# Patient Record
Sex: Male | Born: 1950
Health system: Southern US, Community
[De-identification: ages and names within clinical notes are randomized; demographics above are authoritative.]

## PROBLEM LIST (undated history)

## (undated) DIAGNOSIS — M5030 Other cervical disc degeneration, unspecified cervical region: Secondary | ICD-10-CM

## (undated) DIAGNOSIS — K222 Esophageal obstruction: Secondary | ICD-10-CM

## (undated) DIAGNOSIS — E119 Type 2 diabetes mellitus without complications: Secondary | ICD-10-CM

## (undated) DIAGNOSIS — I471 Supraventricular tachycardia, unspecified: Secondary | ICD-10-CM

## (undated) DIAGNOSIS — Z973 Presence of spectacles and contact lenses: Secondary | ICD-10-CM

## (undated) DIAGNOSIS — H9319 Tinnitus, unspecified ear: Secondary | ICD-10-CM

## (undated) DIAGNOSIS — I499 Cardiac arrhythmia, unspecified: Secondary | ICD-10-CM

## (undated) DIAGNOSIS — J309 Allergic rhinitis, unspecified: Secondary | ICD-10-CM

## (undated) DIAGNOSIS — K219 Gastro-esophageal reflux disease without esophagitis: Secondary | ICD-10-CM

## (undated) DIAGNOSIS — M1990 Unspecified osteoarthritis, unspecified site: Secondary | ICD-10-CM

## (undated) HISTORY — PX: TONSILLECTOMY: SUR1361

## (undated) HISTORY — DX: Allergic rhinitis, unspecified: J30.9

## (undated) HISTORY — DX: Supraventricular tachycardia, unspecified: I47.10

## (undated) HISTORY — DX: Type 2 diabetes mellitus without complications: E11.9

## (undated) HISTORY — DX: Esophageal obstruction: K22.2

## (undated) HISTORY — DX: Other cervical disc degeneration, unspecified cervical region: M50.30

## (undated) HISTORY — DX: Supraventricular tachycardia: I47.1

## (undated) HISTORY — PX: APPENDECTOMY: SHX54

## (undated) HISTORY — PX: INGUINAL HERNIA REPAIR: SUR1180

---
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
## 1973-10-30 HISTORY — PX: ORIF WRIST FRACTURE: SHX2133

## 2004-12-27 ENCOUNTER — Ambulatory Visit: Payer: Self-pay

## 2004-12-30 ENCOUNTER — Ambulatory Visit: Payer: Self-pay | Admitting: Internal Medicine

## 2005-01-06 ENCOUNTER — Ambulatory Visit: Payer: Self-pay

## 2005-01-25 ENCOUNTER — Ambulatory Visit: Payer: Self-pay | Admitting: Internal Medicine

## 2005-03-02 ENCOUNTER — Ambulatory Visit: Payer: Self-pay | Admitting: Internal Medicine

## 2006-03-06 ENCOUNTER — Emergency Department (HOSPITAL_COMMUNITY): Admission: EM | Admit: 2006-03-06 | Discharge: 2006-03-06 | Payer: Self-pay | Admitting: Emergency Medicine

## 2008-10-19 ENCOUNTER — Ambulatory Visit: Payer: Self-pay | Admitting: Internal Medicine

## 2008-10-30 HISTORY — PX: CARDIAC CATHETERIZATION: SHX172

## 2008-11-10 ENCOUNTER — Ambulatory Visit: Payer: Self-pay

## 2008-12-09 ENCOUNTER — Ambulatory Visit: Payer: Self-pay | Admitting: Internal Medicine

## 2008-12-09 ENCOUNTER — Ambulatory Visit (HOSPITAL_COMMUNITY): Admission: RE | Admit: 2008-12-09 | Discharge: 2008-12-09 | Payer: Self-pay | Admitting: Internal Medicine

## 2008-12-16 ENCOUNTER — Ambulatory Visit: Payer: Self-pay | Admitting: Internal Medicine

## 2009-01-15 ENCOUNTER — Ambulatory Visit: Payer: Self-pay | Admitting: Internal Medicine

## 2011-02-14 LAB — CBC
MCHC: 35 g/dL (ref 30.0–36.0)
MCV: 89.5 fL (ref 78.0–100.0)
Platelets: 162 10*3/uL (ref 150–400)
WBC: 6.2 10*3/uL (ref 4.0–10.5)

## 2011-02-14 LAB — APTT: aPTT: 26 seconds (ref 24–37)

## 2011-02-14 LAB — BASIC METABOLIC PANEL
BUN: 15 mg/dL (ref 6–23)
Calcium: 9.2 mg/dL (ref 8.4–10.5)
Chloride: 107 mEq/L (ref 96–112)
Creatinine, Ser: 0.72 mg/dL (ref 0.4–1.5)

## 2011-03-14 NOTE — Assessment & Plan Note (Signed)
[facility]                         ELECTROPHYSIOLOGY OFFICE NOTE   CLEMENCE, VERMEER                 MRN:          [id number]  DATE:12/16/2008                            DOB:          [birth year]    INTRODUCTION:  Mr. 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  Graciela Husbands on December 09, 2008, who presents today for further evaluation of  right groin pain.   PROBLEM LIST:  1. 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  2. Gastroesophageal reflux disease.   CURRENT MEDICATIONS:  None.   INTERVAL HISTORY:  Mr. Dilorenzo presents today for followup after his  recent EP study and radiofrequency ablation on December 09, 2008.  He  notes doing well following his procedure.  Over the past 3 days, he has  noted a fullness within his right groin as well as mild pain with  ambulation.  He has noticed minimal bruising over the site and presents  today for further evaluation.  He denies moderate or severe pain and  continues to ambulate without difficulty.  He is unaware of any  strenuous activities or precipitants for his worsening discomfort.  He  denies lower extremity edema.  He also denies chest pain, shortness of  breath, palpitations, presyncope, syncope, or other concerns today.   PHYSICAL EXAMINATION:  VITAL SIGNS:  Blood pressure 123/89, heart rate  52, respirations 18, weight not checked today.  GENERAL:  Mr. Reichardt is alert and oriented x3.  HEENT:  Normocephalic, atraumatic.  Sclerae clear.  Conjunctivae pink.  Oropharynx clear.  NECK:  Supple.  No thyromegaly, lymphadenopathy, or bruits.  LUNGS:  Clear to auscultation bilaterally.  HEART:  Regular rate and rhythm.  No murmurs, rubs, or gallops.  GI:  Soft, nontender, nondistended.  Positive  bowel sounds.  EXTREMITIES:  No clubbing, cyanosis, or edema.  PELVIC:  The patient's right and left groins are without significant  tenderness to palpation, hematoma, or bruits.  He has minimal ecchymoses  over the puncture site from his recent EP procedure on the right side.  I think that his right groin is otherwise completely benign.  NEUROLOGIC:  Cranial nerves II through XII are intact.  Strength and  sensation are intact.   EKG, sinus bradycardia of 52 beats per minute, otherwise unremarkable.   IMPRESSION:  Mr. Bartusek is a pleasant 60 year old gentleman who  presents today for a groin check after his recent ablation for  supraventricular tachycardia.  He has had no further arrhythmias.  His  exam is completely benign today.  I did not hear bruit or see hematoma.  I think that a pseudoaneurysm, aneurysm, or atrioventricular fistula  would be unlikely.   PLAN:  Mr. Perrilloux will continue to avoid strenuous activities for the  next week.  I have instructed him to place warm compresses as needed to  the right groin.  He is aware to contact our office should he develop  any significant swelling, bleeding, severe  pain, further ecchymoses, or  other concerns from the right groin.  I have discussed this plan with  Dr. Graciela Husbands.     Hillis Range, MD  Electronically Signed    JA/MedQ  DD: 12/16/2008  DT: 12/17/2008  Job #: [id number]   cc:   Duke Salvia, MD, [facility]

## 2011-03-14 NOTE — Discharge Summary (Signed)
NAME:  IZAK, KIZEWSKI        ACCOUNT NO.:  [id number]   MEDICAL RECORD NO.:  [id number]          PATIENT TYPE:  OIB   LOCATION:  4715                         FACILITY:  [facility]   PHYSICIAN:  Duke Salvia, MD, FACCDATE OF BIRTH:  01-19-1951   DATE OF ADMISSION:  12/09/2008  DATE OF DISCHARGE:  12/09/2008                               DISCHARGE SUMMARY   FINAL DIAGNOSES:  1. Tachy palpitations with abrupt onset 50-year history of this.  2. Status post electrophysiology study with radiofrequency catheter      ablation of atrioventricular nodal reentry tachycardia and slow      pathway modification by Dr. Graciela Husbands.   SECONDARY DIAGNOSES:  1. Gastroesophageal reflux disease.  2. Status post herniorrhaphy x2.  3. Thyroglossal duct surgery.  4. Navicular surgery.  5. Status post appendectomy.  6. Myoview study, November 10, 2008.  This shows ejection fraction 48%.      The patient did have good exercise capacity.  The overall left      ventricular function is normal.  The patient did not have chest      pain or dyspnea during this normal stress nuclear study.   PROCEDURE:  December 09, 2008 electrophysiology study radiofrequency  catheter ablation of atrioventricular node reentry tachycardia with slow  pathway modification.   BRIEF HISTORY:  Mr. Westly is a 59 year old man.  He has recurrent  abrupt tachy palpitations.  They are abrupt in onset and offset.  They  have been going on for 50 years.  They are diuretic positive, they FROG  positive.   Symptoms are increasing shortness of breath, presyncope, chest burning  and they have started to occur more frequently with an on and off  pattern that lasts for a couple of minutes then stops, then starts again  and goes on and off from one half hour to one hour.  He has eliminated  all inciting trigger agents.  He still uses some caffeine, however.  Initiation in the past has been associated with the PVC.  The patient  will have a  Myoview study prior to undergoing catheter ablation.  These  tachyarrhythmias are supraventricular and likely represent AV reentry.  Catheter ablation has been discussed again.  The patient wishes to go  ahead with this procedure.  This will be done on an elective basis.   HOSPITAL COURSE:  The patient presents electively on December 09, 2008.  He underwent electrophysiology study with successful slow P-wave  modification with elimination of a substrate by Dr. Sherryl Manges.  The  patient in sinus rhythm at the time of discharge after the procedure on  December 09, 2008.  He goes home on his medication  which is ranitidine 300 mg twice daily.  His new medication now enteric-  coated aspirin 81 mg daily for the next 6 weeks.  He follows up with  [facility], Dr. Thomasena Edis office will call with  that appointment.   TIME FOR THIS DICTATION AND EXAM:  30 minutes.      Maple Mirza, [place]      Duke Salvia, MD, [facility]  Electronically Signed  GM/MEDQ  D:  12/09/2008  T:  12/10/2008  Job:  [id number]   cc:   Molly Maduro A. Nicholos Johns, M.D.

## 2011-03-14 NOTE — Assessment & Plan Note (Signed)
[name]                         ELECTROPHYSIOLOGY OFFICE NOTE   [name], [name]                 MRN:          [id number]  DATE:10/19/2008                            DOB:          08-30-1951    [name] is seen on October 19, 2008, at his own recognizance. I  had seen [name] some years ago at the request of [name] and  most recently in December 2006 because of recurrent abrupt onset-offset  tachy palpitations that were going on for about 50 years.  They are  diuretic positive, they are FROG positive.  They are associated  increasingly with shortness of breath, presyncope, chest burning, and  have started to occur in runs that is to say they will stop and they  will resume a couple of minutes later and this can go on for half an  hour or an hour.  They would last somewhere in the range of 10-30  minutes.   He has eliminated all illicit agents.  He does still use some caffeine.  Initiation in the past has been associated with a PVC.   PAST MEDICAL HISTORY:  Notable for GE reflux disease.   REVIEW OF SYSTEMS:  Otherwise broadly negative apart from having metal  filings in his eyes which presumably precludes MRI scanning.   PAST SURGICAL HISTORY:  Notable for herniorrhaphy x2, thyroglossal duct  surgery, navicular surgery, and appendectomy.   SOCIAL HISTORY:  He is married.  He has two children, 28 and 26, and  apparently his daughter has tachy palpitations.  He uses alcohol and  coffee, but does not use cigarettes or recreational drugs.  He works as  a Academic librarian.   MEDICATIONS:  He takes no medications.   ALLERGIES:  He has no known drug allergies.   PHYSICAL EXAMINATION:  GENERAL:  He is a middle-aged Caucasian male  appearing in his stated age of 74.  VITAL SIGNS:  His blood pressure is 126/76, his pulse was 62, his weight  was 198 which is up 9 pounds in the last 3-1/2 years.  HEENT:  No icterus or xanthoma.  NECK:  Veins were flat.  The carotids were brisk and full bilaterally  without bruits.  BACK:  Without kyphosis or scoliosis.  LUNGS:  Clear.  HEART:  Sounds were regular without murmurs or gallops.  ABDOMEN:  Soft with active bowel sounds without midline pulsation or  hepatomegaly.  EXTREMITIES:  Femoral pulses were 2+, distal pulses were intact.  There  was no clubbing, cyanosis, or edema.  NEUROLOGICAL:  Grossly normal.  SKIN:  Warm and dry.   Electrocardiogram dated today demonstrated sinus rhythm at 52 with  intervals of 0.15/0.11/0.40.  There was no evidence of ventricular pre-  excitation.  Most previously, a much more distinct isoelectric segment  in lead II now gives way to a suggestion of a delta wave, but is not  seen in lead V4 where we would expect to see something.   IMPRESSION:  1. Supraventricular tachycardia, atrioventricular nodal reentry by      symptoms, atrioventricular reentry by initiation with a premature  ventricular contraction and age of onset.  2. Chest discomfort associated with supraventricular tachycardia,      borderline diabetes?   [name] has had a significant uptake in the frequency of his SVT  episodes.  They likely represent AV reentry notwithstanding their FROG  positivity.  We have discussed catheter ablation again.  We have  discussed the potential benefits as well as potential risks including  but not limited to death, perforation, heart block requiring pacemaker  implantation, and perforation.  He understands these risks.  He would  like to proceed.  The issue is going to be time off from work.  I have  given him information from [facility] of cardiology.  He is to  discuss this with his wife and he will call us back.   He will need a Myoview scanning prior to undertaking catheter ablation.     Duke Salvia, MD, [facility]  Electronically Signed    SCK/MedQ  DD: 10/19/2008  DT: 10/20/2008  Job #: [id number]   cc:   Deboraha Sprang @  [address]

## 2011-03-14 NOTE — Cardiovascular Report (Signed)
[facility]                   [facility] ELECTROPHYSIOLOGY DEVICE CLINIC NOTE   [name], [name]                 MRN:          [id number]  DATE:01/15/2009                            DOB:          [birth year]    [name] is seen in followup for AV nodal modification for AV nodal  reentry.  He has had no recurrent tachycardia.  He has had multiple  episodes of anticipated tachycardia associated with his triggering  sensation, but no episodes.   He had some problems with groin swelling.  After the procedure, he was  seen by Dr. Johney Frame, that has fully resolved.   He is currently taking no medications.   PHYSICAL EXAMINATION:  VITAL SIGNS:  His blood pressure is 126/76 and  his pulse is 45.  LUNGS:  Clear.  HEART:  Sounds are regular.  EXTREMITIES:  Without edema.  ABDOMEN:  Groins are not examined.   IMPRESSION:  Atrioventricular nodal reentry tachycardia status post slow  pathway modification.   [name] is doing fine.  We will see me him again at his request.     Duke Salvia, MD, [facility]  Electronically Signed    SCK/MedQ  DD: 01/15/2009  DT: 01/16/2009  Job #: [id number]   cc:   Molly Maduro A. Nicholos Johns, M.D.

## 2011-03-14 NOTE — Op Note (Signed)
NAME:  [name], [name]        ACCOUNT NO.:  [id number]   MEDICAL RECORD NO.:  [id number]          PATIENT TYPE:  OIB   LOCATION:  4715                         FACILITY:  [facility]   PHYSICIAN:  Duke Salvia, MD, FACCDATE OF BIRTH:  1951-06-12   DATE OF PROCEDURE:  12/09/2008  DATE OF DISCHARGE:  12/09/2008                               OPERATIVE REPORT   PREOPERATIVE DIAGNOSIS:  Supraventricular tachycardia.   POSTOPERATIVE DIAGNOSIS:  Atrioventricular nodal reentry.   PROCEDURE:  Invasive electrophysiological study, arrhythmia mapping, and  catheter ablation.   Following obtaining informed consent, the patient was brought to the  electrophysiology laboratory and placed on the fluoroscopic table in the  supine position.  After routine prep and drape, cardiac catheterization  was performed with local anesthesia and conscious sedation.  Noninvasive  blood pressure monitoring and transcutaneous oxygen saturation  monitoring and end-tidal CO2 monitoring were performed continuously  throughout the procedure.  Following the procedure, the catheters were  removed.  Hemostasis was obtained, and the patient was transferred to  the floor in stable condition.   Catheters of 5-French quadripolar catheter was inserted via the left  femoral vein to the AV junction.  A 5-French quadripolar catheter was inserted via the left femoral vein  to the right ventricular apex.  A 6-French octapolar catheter was inserted via the right femoral vein to  the coronary sinus.  A 7-French 4-mm deflectable tip catheter was inserted via an SL2 sheath  mapping sites in the posterior septal space.   Surface leads I, aVF, and V1 were monitored continuously throughout the  procedure.  Following insertion of the catheters, a stimulation protocol  included;  1. Incremental atrial pacing.  2. Incremental ventricular pacing.  3. Single atrial extrastimuli paced cycle length of 700 and 800      milliseconds.   END-TIDAL RESULTS:  End-tidal surface electrocardiogram at basic  intervals.  Initial:  Rhythm:  Sinus; RR interval:  1073 milliseconds; PR interval:  166  milliseconds; QRS duration:  144 milliseconds; QT interval:  437  milliseconds; P-wave duration 120 milliseconds; bundle-branch block:  Absent; pre-excitation:  Absent.  AH interval:  105 milliseconds; HV interval:  47 milliseconds.  Final:  Rhythm:  Sinus; RR interval 1209 milliseconds; PR interval 189  milliseconds; QRS duration:  83 milliseconds; QT interval 438  milliseconds; P-wave duration 140 milliseconds; pre-excitation:  Absent;  bundle-branch block:  Absent.  AH interval 89 milliseconds; and HV interval 49 milliseconds.  AV Wenckebach was 600 milliseconds.  VA Wenckebach was 450 milliseconds.  AV nodal effective refractory period of the fast pathway was 540 msec  and the slow pathway was less than 470 milliseconds.  Following  ablation, the fast pathway ERP at 800 milliseconds was 550 milliseconds  and intermittent slow pathway conduction was only intermittently seen  without conduction of tachycardia.  AV nodal conduction preablation was  discontinued for the echo beats and tachycardia.  No isolated echoes  were seen at this __________straight to tachycardia.  Postprocedure AV  nodal conduction was continuous and when a jump was seen, it was seen  with an isolated echo beat.   Retrograded conduction was  continuous.   Accessory pathway function:  No evidence of accessory pathway was  identified.   Ventricular response to programmed stimulation:  Normal for ventricular  stimulation is described.   Arrhythmias induced:  Slow - fast AV nodal reentrant tachycardia was  reproducibly induced both spontaneously/catheter manipulation as well as  the program stimulation with atrial pacing at 550 or 500 milliseconds  and single atrial premature stimulation at cycle lengths of 750:  540-  570.  It was terminated with  ventricular pacing.   During a typical episode of tachycardia, the VA time was 0 milliseconds,  the HA time was 49 milliseconds, the AH time was 471 milliseconds.  Tachycardia initiation was dependent upon age prolongation and the  atrium cannot be preexcited with ventricular stimulation during His-  bundle refractoriness.   Radiofrequency energy, a total of 43 seconds of RF energy was applied  and 4 pulses following which slow pathway function was only  intermittently seen and no tachycardia was inducible.   Fluoroscopy time, a total of 5 minutes and 30 seconds.  The fluoroscopy  time was utilized at 7.5 frames per second.   IMPRESSION:  1. Sinus bradycardia.  2. Normal atrial function.  3. Abnormal atrioventricular nodal function, this manifested by      inducible slow - fast atrioventricular nodal reentrant tachycardia.      Slow pathway modification eliminated the substrate for the      patient's arrhythmia.  4. Normal His-Purkinje system function.  5. No accessory pathway.  6. Normal ventricular response to programmed stimulation.   SUMMARY:  In conclusion, the results of electrophysiological testing  confirmed AV nodal reentry as the patient's underlying mechanism of  tachycardia.  Slow pathway modification successfully eliminated the  tachycardia substrate.  Endocarditis prophylaxis is recommended for 6  weeks and aspirin daily for 6 weeks.      Duke Salvia, MD, [facility]  Electronically Signed     SCK/MEDQ  D:  12/09/2008  T:  12/10/2008  Job:  [id number]

## 2013-05-05 ENCOUNTER — Ambulatory Visit: Payer: Self-pay | Admitting: General Practice

## 2013-07-08 ENCOUNTER — Other Ambulatory Visit: Payer: Self-pay | Admitting: Orthopedic Surgery

## 2013-07-09 ENCOUNTER — Encounter (HOSPITAL_BASED_OUTPATIENT_CLINIC_OR_DEPARTMENT_OTHER): Payer: Self-pay | Admitting: *Deleted

## 2013-07-09 NOTE — Progress Notes (Signed)
Add on for tomorrow-pt had hx cardiac ablation 2010 dr Phillips Odor not had to go back to cardiology since-no meds

## 2013-07-10 ENCOUNTER — Ambulatory Visit (HOSPITAL_BASED_OUTPATIENT_CLINIC_OR_DEPARTMENT_OTHER): Payer: 59 | Admitting: Anesthesiology

## 2013-07-10 ENCOUNTER — Ambulatory Visit (HOSPITAL_BASED_OUTPATIENT_CLINIC_OR_DEPARTMENT_OTHER)
Admission: RE | Admit: 2013-07-10 | Discharge: 2013-07-10 | Disposition: A | Discharge status: Home / Self Care | Payer: 59 | Source: Ambulatory Visit | Attending: Orthopedic Surgery | Admitting: Orthopedic Surgery

## 2013-07-10 ENCOUNTER — Encounter (HOSPITAL_BASED_OUTPATIENT_CLINIC_OR_DEPARTMENT_OTHER)
Admission: RE | Disposition: A | Discharge status: Home / Self Care | Payer: Self-pay | Source: Ambulatory Visit | Attending: Orthopedic Surgery

## 2013-07-10 ENCOUNTER — Encounter (HOSPITAL_BASED_OUTPATIENT_CLINIC_OR_DEPARTMENT_OTHER): Payer: Self-pay | Admitting: *Deleted

## 2013-07-10 ENCOUNTER — Encounter (HOSPITAL_BASED_OUTPATIENT_CLINIC_OR_DEPARTMENT_OTHER): Payer: Self-pay | Admitting: Anesthesiology

## 2013-07-10 DIAGNOSIS — G5600 Carpal tunnel syndrome, unspecified upper limb: Secondary | ICD-10-CM | POA: Insufficient documentation

## 2013-07-10 HISTORY — DX: Tinnitus, unspecified ear: H93.19

## 2013-07-10 HISTORY — PX: CARPAL TUNNEL RELEASE: SHX101

## 2013-07-10 HISTORY — DX: Cardiac arrhythmia, unspecified: I49.9

## 2013-07-10 HISTORY — DX: Gastro-esophageal reflux disease without esophagitis: K21.9

## 2013-07-10 HISTORY — DX: Presence of spectacles and contact lenses: Z97.3

## 2013-07-10 HISTORY — DX: Unspecified osteoarthritis, unspecified site: M19.90

## 2013-07-10 SURGERY — CARPAL TUNNEL RELEASE
Anesthesia: General | Site: Wrist | Laterality: Left | Wound class: Clean

## 2013-07-10 MED ORDER — ONDANSETRON HCL 4 MG/2ML IJ SOLN
INTRAMUSCULAR | Status: DC | PRN
  Administered 2013-07-10: 4 mg via INTRAVENOUS

## 2013-07-10 MED ORDER — PROPOFOL 10 MG/ML IV BOLUS
INTRAVENOUS | Status: DC | PRN
  Administered 2013-07-10: 200 mg via INTRAVENOUS

## 2013-07-10 MED ORDER — MIDAZOLAM HCL 5 MG/5ML IJ SOLN
INTRAMUSCULAR | Status: DC | PRN
  Administered 2013-07-10: 2 mg via INTRAVENOUS

## 2013-07-10 MED ORDER — LACTATED RINGERS IV SOLN
INTRAVENOUS | Status: DC
  Administered 2013-07-10: 11:00:00 via INTRAVENOUS

## 2013-07-10 MED ORDER — LIDOCAINE HCL (CARDIAC) 20 MG/ML IV SOLN
INTRAVENOUS | Status: DC | PRN
  Administered 2013-07-10: 80 mg via INTRAVENOUS

## 2013-07-10 MED ORDER — ONDANSETRON HCL 4 MG/2ML IJ SOLN: 4.0000 mg | Freq: Four times a day (QID) | INTRAMUSCULAR | Status: DC | PRN

## 2013-07-10 MED ORDER — LIDOCAINE HCL 2 % IJ SOLN
INTRAMUSCULAR | Status: DC | PRN
  Administered 2013-07-10: 4 mL

## 2013-07-10 MED ORDER — OXYCODONE-ACETAMINOPHEN 5-325 MG PO TABS: ORAL_TABLET | ORAL | Status: DC

## 2013-07-10 MED ORDER — OXYCODONE HCL 5 MG/5ML PO SOLN: 5.0000 mg | Freq: Once | ORAL | Status: DC | PRN

## 2013-07-10 MED ORDER — OXYCODONE HCL 5 MG PO TABS: 5.0000 mg | ORAL_TABLET | Freq: Once | ORAL | Status: DC | PRN

## 2013-07-10 MED ORDER — FENTANYL CITRATE 0.05 MG/ML IJ SOLN
INTRAMUSCULAR | Status: DC | PRN
  Administered 2013-07-10: 100 ug via INTRAVENOUS

## 2013-07-10 MED ORDER — DEXAMETHASONE SODIUM PHOSPHATE 10 MG/ML IJ SOLN
INTRAMUSCULAR | Status: DC | PRN
  Administered 2013-07-10: 10 mg via INTRAVENOUS

## 2013-07-10 MED ORDER — CHLORHEXIDINE GLUCONATE 4 % EX LIQD: 60.0000 mL | Freq: Once | CUTANEOUS | Status: DC

## 2013-07-10 MED ORDER — FENTANYL CITRATE 0.05 MG/ML IJ SOLN: 25.0000 ug | INTRAMUSCULAR | Status: DC | PRN

## 2013-07-10 SURGICAL SUPPLY — 36 items
BANDAGE ADHESIVE 1X3 (GAUZE/BANDAGES/DRESSINGS) IMPLANT
BANDAGE COBAN STERILE 2 (GAUZE/BANDAGES/DRESSINGS) ×2 IMPLANT
BANDAGE ELASTIC 3 VELCRO ST LF (GAUZE/BANDAGES/DRESSINGS) IMPLANT
BLADE SURG 15 STRL LF DISP TIS (BLADE) ×1 IMPLANT
BLADE SURG 15 STRL SS (BLADE) ×1
BNDG ESMARK 4X9 LF (GAUZE/BANDAGES/DRESSINGS) ×2 IMPLANT
BRUSH SCRUB EZ PLAIN DRY (MISCELLANEOUS) ×2 IMPLANT
CLOTH BEACON ORANGE TIMEOUT ST (SAFETY) ×2 IMPLANT
CORDS BIPOLAR (ELECTRODE) ×2 IMPLANT
COVER MAYO STAND STRL (DRAPES) ×2 IMPLANT
COVER TABLE BACK 60X90 (DRAPES) ×2 IMPLANT
CUFF TOURNIQUET SINGLE 18IN (TOURNIQUET CUFF) ×2 IMPLANT
DECANTER SPIKE VIAL GLASS SM (MISCELLANEOUS) IMPLANT
DRAPE EXTREMITY T 121X128X90 (DRAPE) ×2 IMPLANT
DRAPE SURG 17X23 STRL (DRAPES) ×2 IMPLANT
GLOVE BIOGEL M STRL SZ7.5 (GLOVE) ×2 IMPLANT
GLOVE ORTHO TXT STRL SZ7.5 (GLOVE) ×2 IMPLANT
GOWN BRE IMP PREV XXLGXLNG (GOWN DISPOSABLE) ×4 IMPLANT
GOWN PREVENTION PLUS XLARGE (GOWN DISPOSABLE) ×2 IMPLANT
NEEDLE 27GAX1X1/2 (NEEDLE) ×2 IMPLANT
PACK BASIN DAY SURGERY FS (CUSTOM PROCEDURE TRAY) ×2 IMPLANT
PAD CAST 3X4 CTTN HI CHSV (CAST SUPPLIES) ×1 IMPLANT
PADDING CAST ABS 4INX4YD NS (CAST SUPPLIES) ×1
PADDING CAST ABS COTTON 4X4 ST (CAST SUPPLIES) ×1 IMPLANT
PADDING CAST COTTON 3X4 STRL (CAST SUPPLIES) ×1
SPLINT PLASTER CAST XFAST 3X15 (CAST SUPPLIES) ×4 IMPLANT
SPLINT PLASTER XTRA FASTSET 3X (CAST SUPPLIES) ×4
SPONGE GAUZE 4X4 12PLY (GAUZE/BANDAGES/DRESSINGS) ×2 IMPLANT
STOCKINETTE 4X48 STRL (DRAPES) ×2 IMPLANT
STRIP CLOSURE SKIN 1/2X4 (GAUZE/BANDAGES/DRESSINGS) ×2 IMPLANT
SUT PROLENE 3 0 PS 2 (SUTURE) ×2 IMPLANT
SYR 3ML 23GX1 SAFETY (SYRINGE) IMPLANT
SYR CONTROL 10ML LL (SYRINGE) ×2 IMPLANT
TOWEL OR 17X24 6PK STRL BLUE (TOWEL DISPOSABLE) ×2 IMPLANT
TRAY DSU PREP LF (CUSTOM PROCEDURE TRAY) ×2 IMPLANT
UNDERPAD 30X30 INCONTINENT (UNDERPADS AND DIAPERS) ×2 IMPLANT

## 2013-07-10 NOTE — Op Note (Signed)
[id number] 

## 2013-07-10 NOTE — Transfer of Care (Signed)
Immediate Anesthesia Transfer of Care Note  Patient: [name]  Procedure(s) Performed: Procedure(s): CARPAL TUNNEL RELEASE (Left)  Patient Location: PACU  Anesthesia Type:General  Level of Consciousness: sedated  Airway & Oxygen Therapy: Patient Spontanous Breathing and Patient connected to face mask oxygen  Post-op Assessment: Report given to PACU RN and Post -op Vital signs reviewed and stable  Post vital signs: Reviewed and stable  Complications: No apparent anesthesia complications

## 2013-07-10 NOTE — Anesthesia Postprocedure Evaluation (Signed)
  Anesthesia Post-op Note  Patient: [name]  Procedure(s) Performed: Procedure(s): CARPAL TUNNEL RELEASE (Left)  Patient Location: PACU  Anesthesia Type:General  Level of Consciousness: awake and alert   Airway and Oxygen Therapy: Patient Spontanous Breathing  Post-op Pain: none  Post-op Assessment: Post-op Vital signs reviewed, Patient's Cardiovascular Status Stable, Respiratory Function Stable, Patent Airway and No signs of Nausea or vomiting  Post-op Vital Signs: Reviewed and stable  Complications: No apparent anesthesia complications

## 2013-07-10 NOTE — Anesthesia Preprocedure Evaluation (Signed)
Anesthesia Evaluation  Patient identified by MRN, date of birth, ID band Patient awake    Reviewed: Allergy & Precautions, H&P , NPO status , Patient's Chart, lab work & pertinent test results  Airway Mallampati: II  Neck ROM: full    Dental   Pulmonary former smoker,          Cardiovascular + dysrhythmias  S/p ablation for tachycardia 2010   Neuro/Psych    GI/Hepatic GERD-  ,  Endo/Other    Renal/GU      Musculoskeletal   Abdominal   Peds  Hematology   Anesthesia Other Findings   Reproductive/Obstetrics                           Anesthesia Physical Anesthesia Plan  ASA: II  Anesthesia Plan: General   Post-op Pain Management:    Induction: Intravenous  Airway Management Planned: LMA  Additional Equipment:   Intra-op Plan:   Post-operative Plan:   Informed Consent: I have reviewed the patients History and Physical, chart, labs and discussed the procedure including the risks, benefits and alternatives for the proposed anesthesia with the patient or authorized representative who has indicated his/her understanding and acceptance.     Plan Discussed with: CRNA, Anesthesiologist and Surgeon  Anesthesia Plan Comments:         Anesthesia Quick Evaluation

## 2013-07-10 NOTE — Brief Op Note (Signed)
07/10/2013  1:14 PM  PATIENT:  [name]  62 y.o. male  PRE-OPERATIVE DIAGNOSIS:  LEFT CARPAL TUNNEL SYNDROME  POST-OPERATIVE DIAGNOSIS:  LEFT CARPAL TUNNEL SYNDROME  PROCEDURE:  Procedure(s): CARPAL TUNNEL RELEASE (Left)  SURGEON:  Surgeon(s) and Role:    * Wyn Forster., MD - Primary  PHYSICIAN ASSISTANT:   ASSISTANTS: Mallory Shirk.A-C   ANESTHESIA:   general  EBL:  Total I/O In: 400 [I.V.:400] Out: -   BLOOD ADMINISTERED:none  DRAINS: none   LOCAL MEDICATIONS USED:  XYLOCAINE   SPECIMEN:  No Specimen  DISPOSITION OF SPECIMEN:  N/A  COUNTS:  YES  TOURNIQUET:   Total Tourniquet Time Documented: Upper Arm (Left) - 8 minutes Total: Upper Arm (Left) - 8 minutes   DICTATION: .Other Dictation: Dictation Number [id number]  PLAN OF CARE: Discharge to home after PACU  PATIENT DISPOSITION:  PACU - hemodynamically stable.   Delay start of Pharmacological VTE agent (>24hrs) due to surgical blood loss or risk of bleeding: not applicable

## 2013-07-10 NOTE — Op Note (Signed)
NAME:  ALMIN, MANWARREN        ACCOUNT NO.:  [id number]  MEDICAL RECORD NO.:  [id number]  LOCATION:                               FACILITY:  [facility]  PHYSICIAN:  Katy Fitch. Ellamay Fors, M.D. DATE OF BIRTH:  16-Apr-1951  DATE OF PROCEDURE:  07/10/2013 DATE OF DISCHARGE:  07/10/2013                              OPERATIVE REPORT   PREOPERATIVE DIAGNOSIS:  Chronic median entrapment neuropathy, left carpal tunnel.  POSTOPERATIVE DIAGNOSIS:  Chronic median entrapment neuropathy, left carpal tunnel.  OPERATION:  Release of  left transverse carpal ligament.  OPERATING SURGEON:  Katy Fitch. Loa Idler, M.D.  ASSISTANT:  Marveen Reeks [name], PA-C  ANESTHESIA:  General by LMA.  SUPERVISING ANESTHESIOLOGIST:  Achille Rich, MD  INDICATIONS:  [name] is a 62 year old gentleman who was referred by Dr. Lurena Joiner for evaluation and management of hand numbness on the left.  He had a remote right carpal tunnel release.  He reported numbness in the median innervated fingers.  He could not sleep comfortably through the night.  His history and clinical examination suggested carpal tunnel syndrome.  Electrodiagnostic studies were completed which revealed moderately severe carpal tunnel syndrome.  After informed consent, he was brought to the operating room at this time, anticipating release of a left transverse carpal ligament.  Preoperatively, he was interviewed by Dr. Chaney Malling of Anesthesia, who recommended general anesthesia by LMA technique.  After informed consent, he was brought to the operating room at this time.  PROCEDURE:  Garnell Exner is brought to room 2 of the [facility] and placed in the supine position upon the operating table.  Following a detailed anesthesia informed consent, general anesthesia by LMA technique was recommended and accepted.  In room 2 under Dr. Seward Meth direct supervision, general anesthesia by LMA technique was induced, followed by routine  Betadine scrub and paint of the left upper extremity.  Following exsanguination of the left arm with Esmarch bandage, the arterial tourniquet on the proximal brachium was inflated 220 mmHg. Procedure commenced with a routine surgical time-out.  The carpal canal was exposed through a 2 cm incision in the line of the ring finger in the palm.  Subcutaneous tissues were carefully divided, taking care to identify the palmar fascia.  This split longitudinally to the common sensory branch of the median nerve.  These were followed back to the median nerve proper which was gently isolated from the transverse carpal ligament with a Insurance risk surveyor.  The ligament was then sequentially released with scissors along its ulnar border extending into the distal forearm.  The volar forearm fascia was released subcutaneously.  This widely opened the carpal canal.  The ulnar bursa was fibrotic and thickened.  No bleeding points along the margin of the released ligament were electrocauterized with bipolar current, followed by repair of the skin with intradermal 3-0 Prolene suture.  Compressive dressing was applied with a volar plaster splint maintaining the wrist in 15 degrees of dorsiflexion.  For aftercare, Mr. Roher is advised he may remove his dressing in 8 days.  He will remove his own suture.  He understands the technique.  I will see him back for followup in approximately 2 weeks in the office. Questions were invited and  answered in detail.  He is provided a prescription for oxycodone 5 mg 1 p.o. q.4-6 hours p.r.n. pain, 20 tablets without refill.     Katy Fitch Elliett Guarisco, M.D.     RVS/MEDQ  D:  07/10/2013  T:  07/10/2013  Job:  [id number]

## 2013-07-10 NOTE — Anesthesia Procedure Notes (Signed)
Procedure Name: LMA Insertion Date/Time: 07/10/2013 12:49 PM Performed by: Burna Cash Pre-anesthesia Checklist: Patient identified, Emergency Drugs available, Suction available and Patient being monitored Patient Re-evaluated:Patient Re-evaluated prior to inductionOxygen Delivery Method: Circle System Utilized Preoxygenation: Pre-oxygenation with 100% oxygen Intubation Type: IV induction Ventilation: Mask ventilation without difficulty LMA: LMA inserted LMA Size: 5.0 Number of attempts: 1 Airway Equipment and Method: bite block Placement Confirmation: positive ETCO2 Tube secured with: Tape Dental Injury: Teeth and Oropharynx as per pre-operative assessment

## 2013-07-10 NOTE — H&P (Signed)
  [name] is an 63 y.o. male.   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  He is right-hand dominant.  He has a history of significant numbness in his left hand when he rides his motorcycle.  He was referred to see Dr. Johna Marshall for electrodiagnostic studies in August of 2014. These documented significant left carpal tunnel syndrome and mild right carpal tunnel syndrome.  He is now referred for an upper extremity orthopaedic consult.      Past Medical History  Diagnosis Date  . Dysrhythmia     hx tachycardia-ablation 2010  . GERD (gastroesophageal reflux disease)     hx-no meds now  . Arthritis   . Wears glasses   . Tinnitus     Past Surgical History  Procedure Laterality Date  . Orif wrist fracture  1975    bone graft-right  . Tonsillectomy    . Cardiac catheterization  2010    cardiac ablation  . Inguinal hernia repair  1956    left age 79  . Inguinal hernia repair      right  . Appendectomy    . Thyroglossal duct cyst  1963    History reviewed. No pertinent family history. Social History:  reports that he quit smoking about 34 years ago. He does not have any smokeless tobacco history on file. He reports that  drinks alcohol. He reports that he does not use illicit drugs.  Allergies: No Known Allergies  No prescriptions prior to admission    No results found for this or any previous visit (from the past 48 hour(s)).  No results found.   Pertinent items are noted in HPI.  Height 6' (1.829 m), weight 82.555 kg (182 lb).  General appearance: alert Head: Normocephalic, without obvious abnormality Neck: supple, symmetrical, trachea midline Resp: clear to auscultation bilaterally Cardio: regular rate and rhythm GI: normal findings: bowel sounds normal Extremities:  Inspection of his hands reveals swelling of his right wrist.  His wrist range of motion is  asymmetrical with palmar flexion 50 degrees right vs. 80 left, dorsiflexion 40 degrees right vs. 80 left.  He has full range of motion of his fingers in flexion/extension.  Pulse and cap refill are intact.  He does not show sign of stenosing tenosynovitis.    His electrodiagnostic studies are reviewed.  He clearly has left carpal tunnel syndrome that is moderately severely and has mild right carpal tunnel syndrome.   Pulses: 2+ and symmetric Skin: normal Neurologic: Grossly normal    Assessment/Plan Impression: Left CTS  Plan: To the OR for left CTR.The procedure, risks,benefits and post-op course were discussed with the patient at length and they were in agreement with the plan.    [name],[name] 07/10/2013, 10:11 AM   H&P documentation: 07/10/2013  -History and Physical Reviewed  -Patient has been re-examined  -No change in the plan of care  Edward Forster, MD

## 2013-07-11 ENCOUNTER — Encounter (HOSPITAL_BASED_OUTPATIENT_CLINIC_OR_DEPARTMENT_OTHER): Payer: Self-pay | Admitting: Orthopedic Surgery

## 2014-04-01 ENCOUNTER — Other Ambulatory Visit: Payer: Self-pay | Admitting: Orthopedic Surgery

## 2014-04-01 DIAGNOSIS — M25512 Pain in left shoulder: Secondary | ICD-10-CM

## 2014-04-07 ENCOUNTER — Other Ambulatory Visit: Payer: Self-pay | Admitting: Orthopedic Surgery

## 2014-04-07 DIAGNOSIS — Z139 Encounter for screening, unspecified: Secondary | ICD-10-CM

## 2014-04-08 ENCOUNTER — Ambulatory Visit
Admission: RE | Admit: 2014-04-08 | Discharge: 2014-04-08 | Disposition: A | Discharge status: Home / Self Care | Payer: 59 | Source: Ambulatory Visit | Attending: Orthopedic Surgery | Admitting: Orthopedic Surgery

## 2014-04-08 DIAGNOSIS — Z139 Encounter for screening, unspecified: Secondary | ICD-10-CM

## 2014-04-08 DIAGNOSIS — M25512 Pain in left shoulder: Secondary | ICD-10-CM

## 2016-01-17 ENCOUNTER — Ambulatory Visit (INDEPENDENT_AMBULATORY_CARE_PROVIDER_SITE_OTHER): Payer: 59 | Admitting: Internal Medicine

## 2016-01-17 ENCOUNTER — Encounter: Payer: Self-pay | Admitting: Internal Medicine

## 2016-01-17 VITALS — BP 142/86 | HR 50 | Ht 72.0 in | Wt 201.0 lb

## 2016-01-17 DIAGNOSIS — I471 Supraventricular tachycardia: Secondary | ICD-10-CM

## 2016-01-17 NOTE — Progress Notes (Signed)
ELECTROPHYSIOLOGY CONSULT NOTE  Patient ID: [name], MRN: [id number], DOB/AGE: 64/07/1951 64 y.o. Admit date: (Not on file) Date of Consult: 01/17/2016  Primary Physician: Lolita Patella, MD Primary Cardiologist: *none Consulting Physician none  Chief Complaint: followup   HPI [name] is a 65 y.o. male  Referred by his request with his history of SVT.  We had seen him in 2010 for supraventricular tachycardia. He underwent catheter ablation at which time he was found and AV nodal reentry.  He has had no recurrent sustained tachycardia. He does have palpitations which he recalls are the same as his triggering events. These happen infrequently, a couple times a month. They are not problem.   He denies chest pain shortness of breath peripheral edema nocturnal dyspnea or syncope. 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 He has no cardiovascular complaints.   Past Medical History  Diagnosis Date  . Dysrhythmia     hx tachycardia-ablation 2010  . GERD (gastroesophageal reflux disease)     hx-no meds now  . Arthritis   . Wears glasses   . Tinnitus   . PSVT (paroxysmal supraventricular tachycardia) (HCC)   . Degenerative cervical disc   . Diabetes mellitus without complication (HCC)   . Allergic rhinitis   . Esophageal stricture       Surgical History:  Past Surgical History  Procedure Laterality Date  . Orif wrist fracture  1975    bone graft-right  . Tonsillectomy    . Cardiac catheterization  2010    cardiac ablation  . Inguinal hernia repair  1956    left age 89  . Inguinal hernia repair      right  . Appendectomy    . Thyroglossal duct cyst  1963  . Carpal tunnel release Left 07/10/2013    Procedure: CARPAL TUNNEL RELEASE;  Surgeon: Wyn Forster., MD;  Location: [facility];  Service: Orthopedics;  Laterality: Left;     Home Meds: Prior to Admission medications     Medication Sig Start Date End Date Taking? Authorizing Provider  aspirin 81 MG tablet Take 81 mg by mouth daily.    Historical Provider, MD  cyclobenzaprine (FLEXERIL) 10 MG tablet Take 10 mg by mouth 3 (three) times daily as needed for muscle spasms.    Historical Provider, MD  naproxen (NAPROSYN) 500 MG tablet Take 500 mg by mouth 2 (two) times daily as needed (only uses in evening).    Historical Provider, MD  omeprazole (PRILOSEC) 20 MG capsule Take 20 mg by mouth daily.    Historical Provider, MD    Allergies:  Allergies  Allergen Reactions  . Barium-Containing Compounds     Social History   Social History  . Marital Status: Married    Spouse Name: N/A  . Number of Children: N/A  . Years of Education: N/A   Occupational History  . Not on file.   Social History Main Topics  . Smoking status: Former Smoker    Quit date: 07/10/1979  . Smokeless tobacco: Not on file  . Alcohol Use: Yes     Comment: daily beer or alcohol  . Drug Use: No  . Sexual Activity: Not on file   Other Topics Concern  . Not on file   Social History Narrative     History reviewed. No pertinent family history.   ROS:  Please see the history of present illness.  All other systems reviewed and negative.    Physical Exam:   Blood pressure 142/86, pulse 50, height 6' (1.829 m), weight 201 lb (91.173 kg). General: Well developed, well nourished male in no acute distress. Head: Normocephalic, atraumatic, sclera non-icteric, no xanthomas, nares are without discharge. EENT: normal  Lymph Nodes:  none Neck: Negative for carotid bruits. JVD not elevated. Back:without scoliosis kyphosis  Lungs: Clear bilaterally to auscultation without wheezes, rales, or rhonchi. Breathing is unlabored. Heart: RRR with S1 S2. No  murmur . No rubs, or gallops appreciated. Abdomen: Soft, non-tender, non-distended with normoactive bowel sounds. No hepatomegaly. No rebound/guarding. No obvious abdominal masses. Msk:   Strength and tone appear normal for age. Extremities: No clubbing or cyanosis. No * edema.  Distal pedal pulses are 2+ and equal bilaterally. Skin: Warm and Dry Neuro: Alert and oriented X 3. CN III-XII intact Grossly normal sensory and motor function . Psych:  Responds to questions appropriately with a normal affect.      Labs: Cardiac Enzymes No results for input(s): CKTOTAL, CKMB, TROPONINI in the last 72 hours. CBC Lab Results  Component Value Date   WBC 6.2 12/09/2008   HGB 14.1 07/10/2013   HCT 41.8 12/09/2008   MCV 89.5 12/09/2008   PLT 162 12/09/2008   PROTIME: No results for input(s): LABPROT, INR in the last 72 hours. Chemistry No results for input(s): NA, K, CL, CO2, BUN, CREATININE, CALCIUM, PROT, BILITOT, ALKPHOS, ALT, AST, GLUCOSE in the last 168 hours.  Invalid input(s): LABALBU Lipids No results found for: CHOL, HDL, LDLCALC, TRIG BNP No results found for: PROBNP Thyroid Function Tests: No results for input(s): TSH, T4TOTAL, T3FREE, THYROIDAB in the last 72 hours.  Invalid input(s): FREET3 Miscellaneous No results found for: DDIMER  Radiology/Studies:  No results found.  EKG: Sinus 49 18/10/42 IRBB  Assessment and Plan:  SVT   Palpitations  Elevated blood pressure   The patient has no recurrent SVT. He has had infrequent palpitations similar to his triggers prior to his ablation. We are glad to see him as needed  Blood pressures He says at home are in the morning normal range.       Sherryl Manges ]

## 2016-01-17 NOTE — Patient Instructions (Signed)
Medication Instructions: - Your physician recommends that you continue on your current medications as directed. Please refer to the Current Medication list given to you today.  Labwork: - none  Procedures/Testing: - none  Follow-Up: - Dr. Klein will see you back on an as needed basis.  Any Additional Special Instructions Will Be Listed Below (If Applicable).     If you need a refill on your cardiac medications before your next appointment, please call your pharmacy.   

## 2016-02-23 ENCOUNTER — Other Ambulatory Visit: Payer: Self-pay | Admitting: Family Medicine

## 2016-02-23 ENCOUNTER — Ambulatory Visit
Admission: RE | Admit: 2016-02-23 | Discharge: 2016-02-23 | Disposition: A | Discharge status: Home / Self Care | Payer: 59 | Source: Ambulatory Visit | Attending: Family Medicine | Admitting: Family Medicine

## 2016-02-23 DIAGNOSIS — M542 Cervicalgia: Secondary | ICD-10-CM

## 2016-04-27 ENCOUNTER — Other Ambulatory Visit: Payer: Self-pay | Admitting: Family Medicine

## 2016-04-27 DIAGNOSIS — M542 Cervicalgia: Secondary | ICD-10-CM

## 2016-04-27 DIAGNOSIS — Z77018 Contact with and (suspected) exposure to other hazardous metals: Secondary | ICD-10-CM

## 2016-05-08 ENCOUNTER — Ambulatory Visit
Admission: RE | Admit: 2016-05-08 | Discharge: 2016-05-08 | Disposition: A | Discharge status: Home / Self Care | Payer: 59 | Source: Ambulatory Visit | Attending: Family Medicine | Admitting: Family Medicine

## 2016-05-08 DIAGNOSIS — Z77018 Contact with and (suspected) exposure to other hazardous metals: Secondary | ICD-10-CM

## 2016-05-08 DIAGNOSIS — M542 Cervicalgia: Secondary | ICD-10-CM

## 2016-09-14 ENCOUNTER — Ambulatory Visit
Admission: RE | Admit: 2016-09-14 | Discharge: 2016-09-14 | Disposition: A | Discharge status: Home / Self Care | Payer: 59 | Source: Ambulatory Visit | Attending: Family Medicine | Admitting: Family Medicine

## 2016-09-14 ENCOUNTER — Other Ambulatory Visit: Payer: Self-pay | Admitting: Family Medicine

## 2016-09-14 DIAGNOSIS — R911 Solitary pulmonary nodule: Secondary | ICD-10-CM

## 2017-02-21 DIAGNOSIS — B9689 Other specified bacterial agents as the cause of diseases classified elsewhere: Secondary | ICD-10-CM | POA: Diagnosis not present

## 2017-02-21 DIAGNOSIS — L02821 Furuncle of head [any part, except face]: Secondary | ICD-10-CM | POA: Diagnosis not present

## 2017-03-11 IMAGING — CT CT CHEST W/O CM
1 of 2 series · 14 of 30 positions shown, 18 images · non-contrast
Comparison: Chest radiograph September 12, 2016

CLINICAL DATA: Pneumonia

EXAM:
CT CHEST WITHOUT CONTRAST
TECHNIQUE: Multidetector CT imaging of the chest was performed following the
standard protocol without IV contrast.

[Series 2: chest w/(date) · axial · 0.82mm/px · z∈[+1115,+1403]mm · 14 of 169 slices shown, 18 images]
[im 13/169  mediastinal]
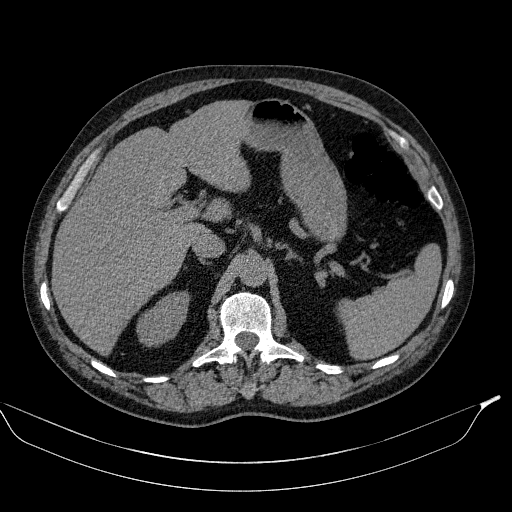
[im 13/169  lung]
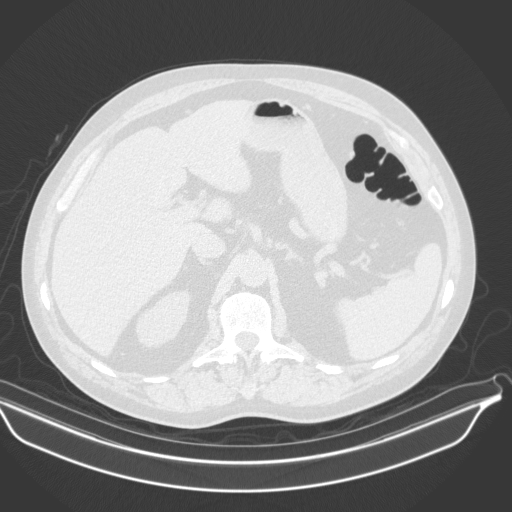
[im 25/169  lung]
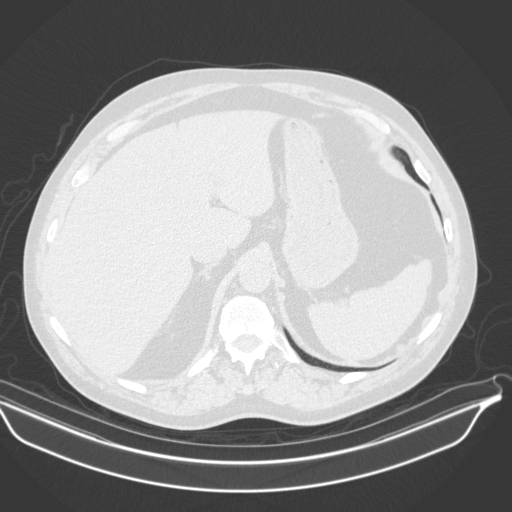
[im 37/169  lung]
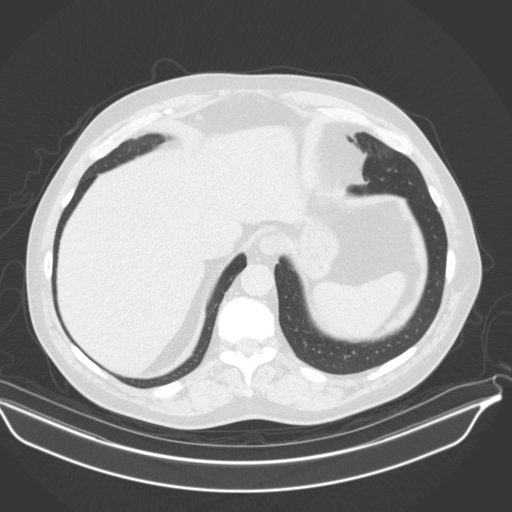
[im 49/169  lung]
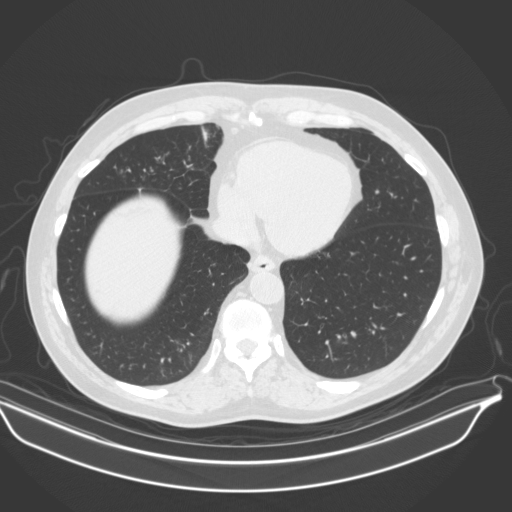
[im 61/169  mediastinal]
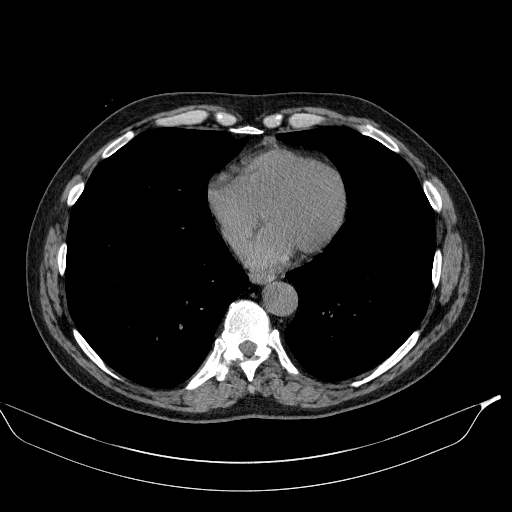
[im 61/169  lung]
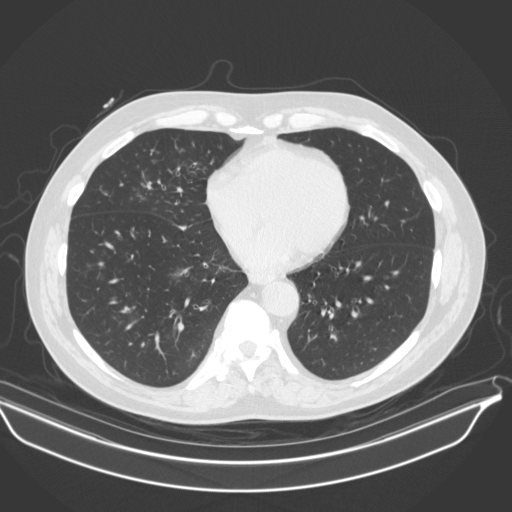
[im 73/169  lung]
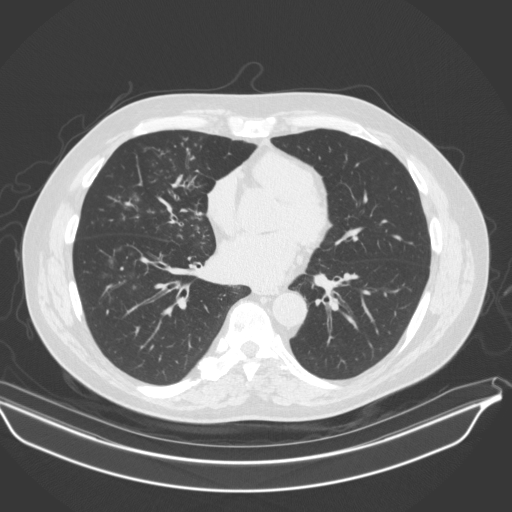
[im 80/169  lung]
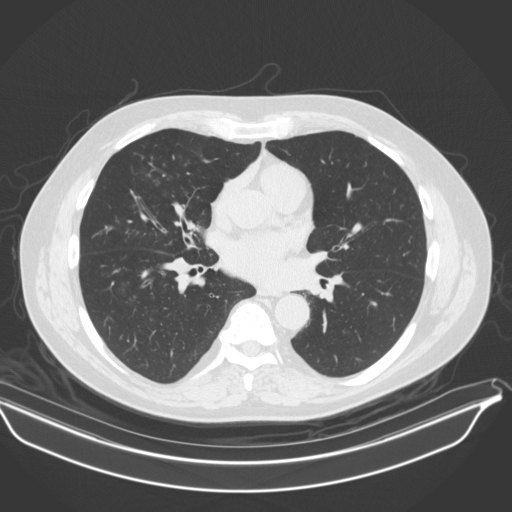
[im 85/169  lung]
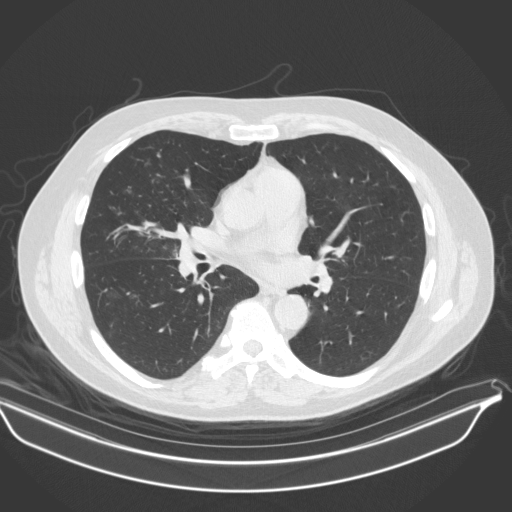
[im 97/169  mediastinal]
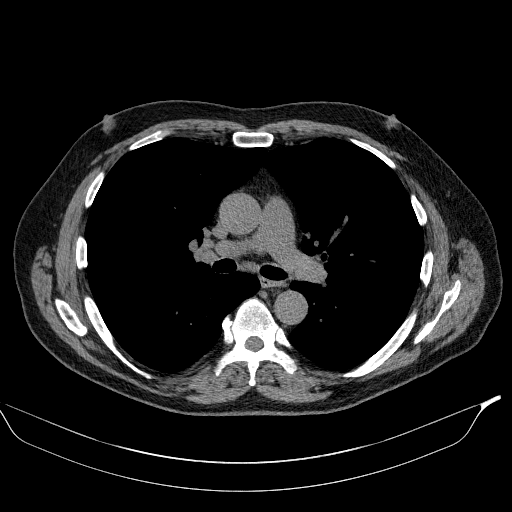
[im 97/169  lung]
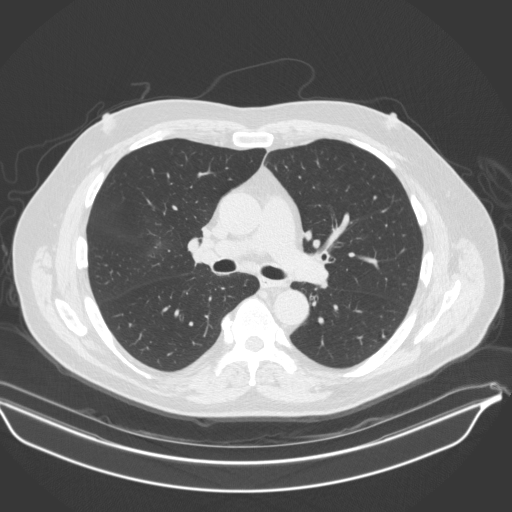
[im 109/169  lung]
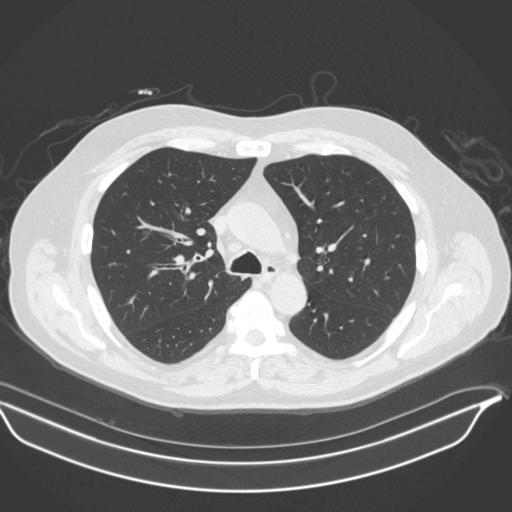
[im 121/169  lung]
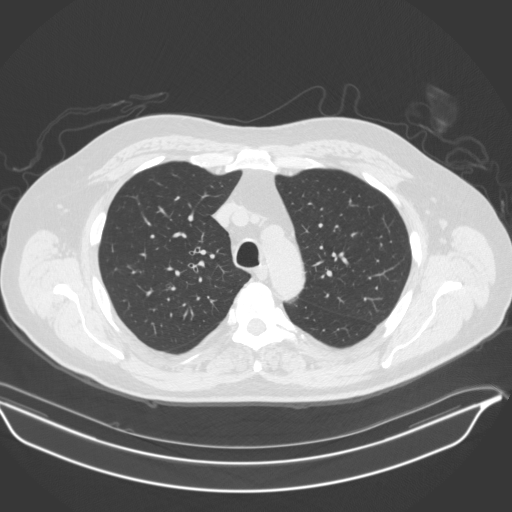
[im 133/169  lung]
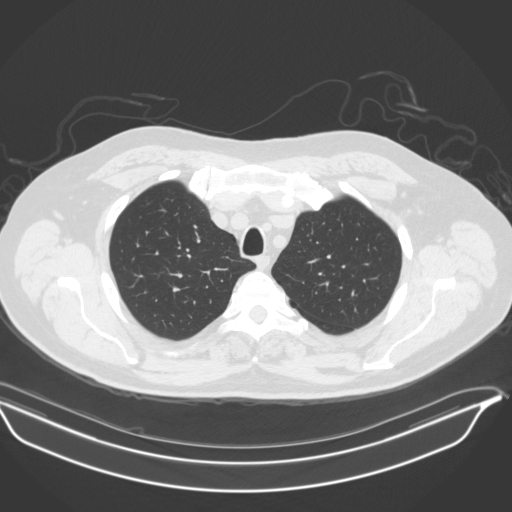
[im 145/169  mediastinal]
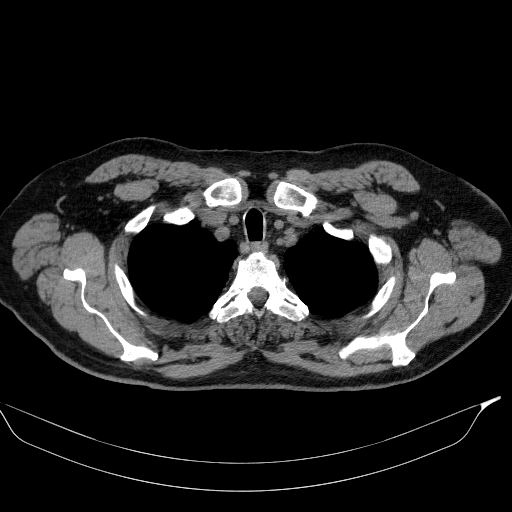
[im 145/169  lung]
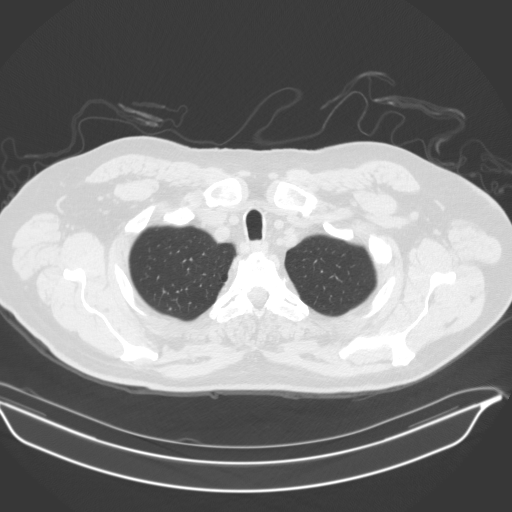
[im 157/169  lung]
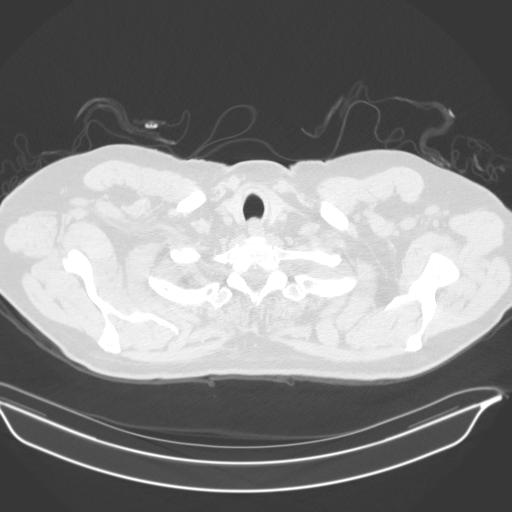

[14 of 30 positions shown; findings below may reference images not displayed]

FINDINGS: Cardiovascular: There is no appreciable thoracic aortic aneurysm.
The visualized great vessels appear normal. There are scattered foci
of calcification in the aortic arch region. The pericardium is not
appreciably thickened.

Mediastinum/Nodes: Thyroid appears unremarkable. There are are a few
subcentimeter mediastinal lymph nodes. By size criteria, there is no
adenopathy.

Lungs/Pleura: There is a small bulla in the medial aspect of the
right upper lobe anteriorly. There is currently mild volume loss in
the right middle lobe. The right middle lobe currently is not
collapsed. There are patchy areas of infiltrate in the right middle
lobe and in portions of the superior segment of the right lower
lobe. The lungs elsewhere are clear. There is lower lobe and right
middle lobe bronchiectatic change. There is no pleural thickening or
pleural effusion evident.

Upper Abdomen: In the visualized upper abdomen, there is a
nonobstructing calculus in the upper pole the right kidney measuring
7 x 5 mm. Visualized upper abdominal structures otherwise appear
normal.

Musculoskeletal: There are no blastic or lytic bone lesions. There
is evidence of old healed fractures of the posterior right eighth
and ninth ribs.
IMPRESSION: Mild atelectasis in the right middle lobe. No consolidation or
collapse in this area. There are multiple small areas of airspace
opacity in the right middle lobe, particular lateral segment as well
as in portions of the superior segment right lower lobe consistent
with patchy pneumonia. There is fairly mild bilateral lower lobe and
right middle lobe bronchiectasis.

There are scattered mediastinal lymph nodes, subcentimeter, without
adenopathy by size criteria.

Mild calcification in the aortic arch region noted.

Nonobstructing calculus upper pole right kidney.

## 2018-02-08 DIAGNOSIS — D128 Benign neoplasm of rectum: Secondary | ICD-10-CM | POA: Insufficient documentation

## 2018-02-08 DIAGNOSIS — E119 Type 2 diabetes mellitus without complications: Secondary | ICD-10-CM | POA: Insufficient documentation

## 2018-02-08 DIAGNOSIS — I471 Supraventricular tachycardia, unspecified: Secondary | ICD-10-CM | POA: Insufficient documentation

## 2018-03-18 DIAGNOSIS — D225 Melanocytic nevi of trunk: Secondary | ICD-10-CM | POA: Diagnosis not present

## 2018-03-18 DIAGNOSIS — X32XXXD Exposure to sunlight, subsequent encounter: Secondary | ICD-10-CM | POA: Diagnosis not present

## 2018-03-18 DIAGNOSIS — B9689 Other specified bacterial agents as the cause of diseases classified elsewhere: Secondary | ICD-10-CM | POA: Diagnosis not present

## 2018-03-18 DIAGNOSIS — L570 Actinic keratosis: Secondary | ICD-10-CM | POA: Diagnosis not present

## 2018-03-18 DIAGNOSIS — L02821 Furuncle of head [any part, except face]: Secondary | ICD-10-CM | POA: Diagnosis not present

## 2018-12-02 DIAGNOSIS — R05 Cough: Secondary | ICD-10-CM | POA: Diagnosis not present

## 2018-12-02 DIAGNOSIS — J209 Acute bronchitis, unspecified: Secondary | ICD-10-CM | POA: Diagnosis not present

## 2018-12-02 DIAGNOSIS — R52 Pain, unspecified: Secondary | ICD-10-CM | POA: Diagnosis not present

## 2019-01-03 DIAGNOSIS — I10 Essential (primary) hypertension: Secondary | ICD-10-CM | POA: Diagnosis not present

## 2019-01-03 DIAGNOSIS — Z8719 Personal history of other diseases of the digestive system: Secondary | ICD-10-CM | POA: Diagnosis not present

## 2019-01-03 DIAGNOSIS — D128 Benign neoplasm of rectum: Secondary | ICD-10-CM | POA: Diagnosis not present

## 2019-01-03 DIAGNOSIS — Z86010 Personal history of colonic polyps: Secondary | ICD-10-CM | POA: Diagnosis not present

## 2019-01-03 DIAGNOSIS — Z1211 Encounter for screening for malignant neoplasm of colon: Secondary | ICD-10-CM | POA: Diagnosis not present

## 2019-01-03 DIAGNOSIS — Z9889 Other specified postprocedural states: Secondary | ICD-10-CM | POA: Diagnosis not present

## 2019-06-09 DIAGNOSIS — E1169 Type 2 diabetes mellitus with other specified complication: Secondary | ICD-10-CM | POA: Diagnosis not present

## 2019-06-09 DIAGNOSIS — E7800 Pure hypercholesterolemia, unspecified: Secondary | ICD-10-CM | POA: Diagnosis not present

## 2019-09-03 DIAGNOSIS — Z20828 Contact with and (suspected) exposure to other viral communicable diseases: Secondary | ICD-10-CM | POA: Diagnosis not present

## 2019-12-15 DIAGNOSIS — M9901 Segmental and somatic dysfunction of cervical region: Secondary | ICD-10-CM | POA: Diagnosis not present

## 2019-12-15 DIAGNOSIS — M47812 Spondylosis without myelopathy or radiculopathy, cervical region: Secondary | ICD-10-CM | POA: Diagnosis not present

## 2019-12-17 DIAGNOSIS — M47812 Spondylosis without myelopathy or radiculopathy, cervical region: Secondary | ICD-10-CM | POA: Diagnosis not present

## 2019-12-17 DIAGNOSIS — M9901 Segmental and somatic dysfunction of cervical region: Secondary | ICD-10-CM | POA: Diagnosis not present

## 2019-12-17 DIAGNOSIS — M26601 Right temporomandibular joint disorder, unspecified: Secondary | ICD-10-CM | POA: Diagnosis not present

## 2019-12-17 DIAGNOSIS — S134XXA Sprain of ligaments of cervical spine, initial encounter: Secondary | ICD-10-CM | POA: Diagnosis not present

## 2019-12-24 DIAGNOSIS — M47812 Spondylosis without myelopathy or radiculopathy, cervical region: Secondary | ICD-10-CM | POA: Diagnosis not present

## 2019-12-24 DIAGNOSIS — M9901 Segmental and somatic dysfunction of cervical region: Secondary | ICD-10-CM | POA: Diagnosis not present

## 2019-12-24 DIAGNOSIS — M26601 Right temporomandibular joint disorder, unspecified: Secondary | ICD-10-CM | POA: Diagnosis not present

## 2019-12-24 DIAGNOSIS — S134XXA Sprain of ligaments of cervical spine, initial encounter: Secondary | ICD-10-CM | POA: Diagnosis not present

## 2020-03-01 DIAGNOSIS — Z1159 Encounter for screening for other viral diseases: Secondary | ICD-10-CM | POA: Diagnosis not present

## 2020-03-04 DIAGNOSIS — Z86010 Personal history of colonic polyps: Secondary | ICD-10-CM | POA: Diagnosis not present

## 2020-03-04 DIAGNOSIS — K640 First degree hemorrhoids: Secondary | ICD-10-CM | POA: Diagnosis not present

## 2020-08-16 DIAGNOSIS — B9689 Other specified bacterial agents as the cause of diseases classified elsewhere: Secondary | ICD-10-CM | POA: Diagnosis not present

## 2020-08-16 DIAGNOSIS — L02821 Furuncle of head [any part, except face]: Secondary | ICD-10-CM | POA: Diagnosis not present

## 2020-09-15 DIAGNOSIS — R059 Cough, unspecified: Secondary | ICD-10-CM | POA: Diagnosis not present

## 2020-09-15 DIAGNOSIS — J0100 Acute maxillary sinusitis, unspecified: Secondary | ICD-10-CM | POA: Diagnosis not present

## 2020-11-25 DIAGNOSIS — I471 Supraventricular tachycardia: Secondary | ICD-10-CM | POA: Diagnosis not present

## 2020-11-25 DIAGNOSIS — M545 Low back pain, unspecified: Secondary | ICD-10-CM | POA: Diagnosis not present

## 2020-11-25 DIAGNOSIS — E1169 Type 2 diabetes mellitus with other specified complication: Secondary | ICD-10-CM | POA: Diagnosis not present

## 2020-11-25 DIAGNOSIS — Z125 Encounter for screening for malignant neoplasm of prostate: Secondary | ICD-10-CM | POA: Diagnosis not present

## 2020-11-25 DIAGNOSIS — E7800 Pure hypercholesterolemia, unspecified: Secondary | ICD-10-CM | POA: Diagnosis not present

## 2020-11-25 DIAGNOSIS — Z0000 Encounter for general adult medical examination without abnormal findings: Secondary | ICD-10-CM | POA: Diagnosis not present

## 2020-11-25 DIAGNOSIS — I700 Atherosclerosis of aorta: Secondary | ICD-10-CM | POA: Diagnosis not present

## 2020-11-25 DIAGNOSIS — K279 Peptic ulcer, site unspecified, unspecified as acute or chronic, without hemorrhage or perforation: Secondary | ICD-10-CM | POA: Diagnosis not present

## 2021-05-16 DIAGNOSIS — E119 Type 2 diabetes mellitus without complications: Secondary | ICD-10-CM | POA: Diagnosis not present

## 2021-05-25 DIAGNOSIS — K279 Peptic ulcer, site unspecified, unspecified as acute or chronic, without hemorrhage or perforation: Secondary | ICD-10-CM | POA: Diagnosis not present

## 2021-05-25 DIAGNOSIS — I471 Supraventricular tachycardia: Secondary | ICD-10-CM | POA: Diagnosis not present

## 2021-05-25 DIAGNOSIS — I700 Atherosclerosis of aorta: Secondary | ICD-10-CM | POA: Diagnosis not present

## 2021-05-25 DIAGNOSIS — E1169 Type 2 diabetes mellitus with other specified complication: Secondary | ICD-10-CM | POA: Diagnosis not present

## 2021-05-25 DIAGNOSIS — E7800 Pure hypercholesterolemia, unspecified: Secondary | ICD-10-CM | POA: Diagnosis not present

## 2021-05-25 DIAGNOSIS — Z79899 Other long term (current) drug therapy: Secondary | ICD-10-CM | POA: Diagnosis not present

## 2021-11-25 DIAGNOSIS — I700 Atherosclerosis of aorta: Secondary | ICD-10-CM | POA: Diagnosis not present

## 2021-11-25 DIAGNOSIS — K279 Peptic ulcer, site unspecified, unspecified as acute or chronic, without hemorrhage or perforation: Secondary | ICD-10-CM | POA: Diagnosis not present

## 2021-11-25 DIAGNOSIS — E1169 Type 2 diabetes mellitus with other specified complication: Secondary | ICD-10-CM | POA: Diagnosis not present

## 2021-11-25 DIAGNOSIS — E7800 Pure hypercholesterolemia, unspecified: Secondary | ICD-10-CM | POA: Diagnosis not present

## 2021-11-25 DIAGNOSIS — M19041 Primary osteoarthritis, right hand: Secondary | ICD-10-CM | POA: Diagnosis not present

## 2021-12-06 DIAGNOSIS — E1169 Type 2 diabetes mellitus with other specified complication: Secondary | ICD-10-CM | POA: Diagnosis not present

## 2021-12-06 DIAGNOSIS — E7800 Pure hypercholesterolemia, unspecified: Secondary | ICD-10-CM | POA: Diagnosis not present

## 2022-03-16 DIAGNOSIS — E119 Type 2 diabetes mellitus without complications: Secondary | ICD-10-CM | POA: Diagnosis not present

## 2022-05-16 DIAGNOSIS — E119 Type 2 diabetes mellitus without complications: Secondary | ICD-10-CM | POA: Diagnosis not present

## 2022-05-16 DIAGNOSIS — M542 Cervicalgia: Secondary | ICD-10-CM | POA: Diagnosis not present

## 2022-06-02 DIAGNOSIS — R739 Hyperglycemia, unspecified: Secondary | ICD-10-CM | POA: Diagnosis not present

## 2022-06-02 DIAGNOSIS — K4020 Bilateral inguinal hernia, without obstruction or gangrene, not specified as recurrent: Secondary | ICD-10-CM | POA: Diagnosis not present

## 2022-06-02 DIAGNOSIS — N200 Calculus of kidney: Secondary | ICD-10-CM | POA: Diagnosis not present

## 2022-06-02 DIAGNOSIS — N132 Hydronephrosis with renal and ureteral calculous obstruction: Secondary | ICD-10-CM | POA: Diagnosis not present

## 2022-06-02 DIAGNOSIS — Z7982 Long term (current) use of aspirin: Secondary | ICD-10-CM | POA: Diagnosis not present

## 2022-06-02 DIAGNOSIS — K5790 Diverticulosis of intestine, part unspecified, without perforation or abscess without bleeding: Secondary | ICD-10-CM | POA: Diagnosis not present

## 2022-06-02 DIAGNOSIS — R1011 Right upper quadrant pain: Secondary | ICD-10-CM | POA: Diagnosis present

## 2022-06-03 ENCOUNTER — Emergency Department (HOSPITAL_COMMUNITY): Payer: Medicare Other

## 2022-06-03 ENCOUNTER — Other Ambulatory Visit: Payer: Self-pay

## 2022-06-03 ENCOUNTER — Emergency Department (HOSPITAL_COMMUNITY)
Admission: EM | Admit: 2022-06-03 | Discharge: 2022-06-03 | Disposition: A | Discharge status: Home / Self Care | Payer: Medicare Other | Attending: Emergency Medicine | Admitting: Emergency Medicine

## 2022-06-03 ENCOUNTER — Encounter (HOSPITAL_COMMUNITY): Payer: Self-pay

## 2022-06-03 DIAGNOSIS — N132 Hydronephrosis with renal and ureteral calculous obstruction: Secondary | ICD-10-CM | POA: Diagnosis not present

## 2022-06-03 DIAGNOSIS — K5790 Diverticulosis of intestine, part unspecified, without perforation or abscess without bleeding: Secondary | ICD-10-CM | POA: Diagnosis not present

## 2022-06-03 DIAGNOSIS — K4020 Bilateral inguinal hernia, without obstruction or gangrene, not specified as recurrent: Secondary | ICD-10-CM | POA: Diagnosis not present

## 2022-06-03 DIAGNOSIS — N200 Calculus of kidney: Secondary | ICD-10-CM

## 2022-06-03 LAB — URINALYSIS, ROUTINE W REFLEX MICROSCOPIC
Bacteria, UA: NONE SEEN
Bilirubin Urine: NEGATIVE
Glucose, UA: NEGATIVE mg/dL
Ketones, ur: 20 mg/dL — AB
Nitrite: NEGATIVE
Protein, ur: NEGATIVE mg/dL
Specific Gravity, Urine: 1.023 (ref 1.005–1.030)
pH: 5 (ref 5.0–8.0)

## 2022-06-03 LAB — BASIC METABOLIC PANEL
Anion gap: 9 (ref 5–15)
BUN: 22 mg/dL (ref 8–23)
CO2: 24 mmol/L (ref 22–32)
Calcium: 10.1 mg/dL (ref 8.9–10.3)
Chloride: 106 mmol/L (ref 98–111)
Creatinine, Ser: 1.32 mg/dL — ABNORMAL HIGH (ref 0.61–1.24)
GFR, Estimated: 58 mL/min — ABNORMAL LOW (ref >60)
Glucose, Bld: 186 mg/dL — ABNORMAL HIGH (ref 70–99)
Potassium: 4.1 mmol/L (ref 3.5–5.1)
Sodium: 139 mmol/L (ref 135–145)

## 2022-06-03 LAB — CBC
HCT: 45.6 % (ref 39.0–52.0)
Hemoglobin: 15 g/dL (ref 13.0–17.0)
MCH: 31.7 pg (ref 26.0–34.0)
MCHC: 32.9 g/dL (ref 30.0–36.0)
MCV: 96.4 fL (ref 80.0–100.0)
Platelets: 148 10*3/uL — ABNORMAL LOW (ref 150–400)
RBC: 4.73 MIL/uL (ref 4.22–5.81)
RDW: 12.8 % (ref 11.5–15.5)
WBC: 12.3 10*3/uL — ABNORMAL HIGH (ref 4.0–10.5)
nRBC: 0 % (ref 0.0–0.2)

## 2022-06-03 MED ORDER — TAMSULOSIN HCL 0.4 MG PO CAPS: 0.4000 mg | ORAL_CAPSULE | Freq: Every day | ORAL | 0 refills | Status: DC

## 2022-06-03 MED ORDER — HYDROCODONE-ACETAMINOPHEN 5-325 MG PO TABS: 1.0000 | ORAL_TABLET | Freq: Four times a day (QID) | ORAL | 0 refills | Status: DC | PRN

## 2022-06-03 MED ORDER — HYDROCODONE-ACETAMINOPHEN 5-325 MG PO TABS: 1.0000 | ORAL_TABLET | Freq: Once | ORAL | Status: DC

## 2022-06-03 NOTE — ED Provider Notes (Signed)
[facility]-EMERGENCY DEPT Provider Note   CSN: [id number] Arrival date & time: 06/02/22  2359     History  Chief Complaint  Patient presents with   Flank Pain    [name] is a 71 y.o. male.  The history is provided by the patient and the spouse.  Flank Pain This is a new problem. The problem occurs constantly. The problem has been resolved. Associated symptoms include abdominal pain. Pertinent negatives include no chest pain.  Patient reports he had sudden onset of right upper quadrant and right flank pain several hours ago.  It seemed to be worse with eating cashews.  No urinary symptoms.  No fevers or vomiting.  He is now pain-free     Home Medications Prior to Admission medications   Medication Sig Start Date End Date Taking? Authorizing Provider  HYDROcodone-acetaminophen (NORCO/VICODIN) 5-325 MG tablet Take 1 tablet by mouth every 6 (six) hours as needed for severe pain. 06/03/22  Yes Zadie Rhine, MD  tamsulosin (FLOMAX) 0.4 MG CAPS capsule Take 1 capsule (0.4 mg total) by mouth daily after supper. 06/03/22  Yes Zadie Rhine, MD  aspirin 81 MG tablet Take 81 mg by mouth daily.    [provider]  cyclobenzaprine (FLEXERIL) 10 MG tablet Take 10 mg by mouth 3 (three) times daily as needed for muscle spasms.    [provider]  naproxen (NAPROSYN) 500 MG tablet Take 500 mg by mouth 2 (two) times daily as needed (only uses in evening).    [provider]  omeprazole (PRILOSEC) 20 MG capsule Take 20 mg by mouth daily.    [provider]      Allergies    Barium sulfate, Barium-containing compounds, and Barium iodide    Review of Systems   Review of Systems  Constitutional:  Negative for fever.  Cardiovascular:  Negative for chest pain.  Gastrointestinal:  Positive for abdominal pain. Negative for vomiting.  Genitourinary:  Positive for flank pain.    Physical Exam Updated Vital Signs BP (!) 155/92    Pulse (!) 57   Temp 97.9 F (36.6 C) (Oral)   Resp 18   Ht 1.829 m (6')   Wt 90.7 kg   SpO2 100%   BMI 27.12 kg/m  Physical Exam CONSTITUTIONAL: Well developed/well nourished, no distress HEAD: Normocephalic/atraumatic EYES: EOMI/PERRL ENMT: Mucous membranes moist NECK: supple no meningeal signs SPINE/BACK:entire spine nontender CV: S1/S2 noted ABDOMEN: soft, nontender, no rebound or guarding, bowel sounds noted throughout abdomen GU:no cva tenderness NEURO: Pt is awake/alert/appropriate, moves all extremitiesx4.  No facial droop.   EXTREMITIES:   full ROM SKIN: warm, color normal PSYCH: no abnormalities of mood noted, alert and oriented to situation  ED Results / Procedures / Treatments   Labs (all labs ordered are listed, but only abnormal results are displayed) Labs Reviewed  URINALYSIS, ROUTINE W REFLEX MICROSCOPIC - Abnormal; Notable for the following components:      Result Value   Hgb urine dipstick SMALL (*)    Ketones, ur 20 (*)    Leukocytes,Ua TRACE (*)    All other components within normal limits  CBC - Abnormal; Notable for the following components:   WBC 12.3 (*)    Platelets 148 (*)    All other components within normal limits  BASIC METABOLIC PANEL - Abnormal; Notable for the following components:   Glucose, Bld 186 (*)    Creatinine, Ser 1.32 (*)    GFR, Estimated 58 (*)  All other components within normal limits    EKG None  Radiology CT Renal Stone Study  Result Date: 06/03/2022 CLINICAL DATA:  Right flank pain. EXAM: CT ABDOMEN AND PELVIS WITHOUT CONTRAST TECHNIQUE: Multidetector CT imaging of the abdomen and pelvis was performed following the standard protocol without IV contrast. RADIATION DOSE REDUCTION: This exam was performed according to the departmental dose-optimization program which includes automated exposure control, adjustment of the mA and/or kV according to patient size and/or use of iterative reconstruction technique.  COMPARISON:  None Available. FINDINGS: Lower chest: Mild atelectasis at the lung bases. Hepatobiliary: No focal liver abnormality is seen. There is fatty infiltration of the liver. No gallstones, gallbladder wall thickening, or biliary dilatation. Pancreas: Unremarkable. No pancreatic ductal dilatation or surrounding inflammatory changes. Spleen: Normal in size without focal abnormality. Adrenals/Urinary Tract: Cysts are noted bilaterally. No renal calculus. There is mild to moderate obstructive uropathy on the right with a 6 x 9 mm stone in the proximal right ureter. No obstructive uropathy on the left. Mild perinephric fat stranding is present on the right. The bladder is unremarkable. Stomach/Bowel: Stomach is within normal limits. Appendix is not seen. No evidence of bowel wall thickening, distention, or inflammatory changes. No free air or pneumatosis. Scattered diverticula are present along the colon without evidence of diverticulitis. Vascular/Lymphatic: Aortic atherosclerosis. No enlarged abdominal or pelvic lymph nodes. Reproductive: The prostate gland is mildly enlarged. Other: Fat containing inguinal hernias are noted bilaterally. No abdominopelvic ascites. Musculoskeletal: Degenerative changes in the thoracolumbar spine. No acute osseous abnormality. IMPRESSION: 1. Mild to moderate obstructive uropathy on the right with a 6 x 9 mm calculus in the proximal right ureter. 2. Hepatic steatosis. 3. Diverticulosis without diverticulitis. 4. Enlarged prostate gland. 5. Aortic atherosclerosis. Electronically Signed   By: Thornell Sartorius M.D.   On: 06/03/2022 03:41    Procedures Procedures    Medications Ordered in ED Medications  HYDROcodone-acetaminophen (NORCO/VICODIN) 5-325 MG per tablet 1 tablet (1 tablet Oral Not Given 06/03/22 0556)    ED Course/ Medical Decision Making/ A&P Clinical Course as of 06/03/22 0559  Sat Jun 03, 2022  0552 Glucose(!): 186 Mild hyperglycemia [DW]  0552 Creatinine(!):  1.32 Renal insufficiency [DW]  0552 Hgb urine dipstick(!): SMALL Hematuria   [DW]    Clinical Course User Index [DW] Zadie Rhine, MD                           Medical Decision Making Amount and/or Complexity of Data Reviewed Labs:  Decision-making details documented in ED Course.  Risk Prescription drug management.   This patient presents to the ED for concern of abdominal pain and flank pain, this involves an extensive number of treatment options, and is a complaint that carries with it a high risk of complications and morbidity.  The differential diagnosis includes but is not limited to cholecystitis, cholelithiasis, pancreatitis, gastritis, peptic ulcer disease, appendicitis, bowel obstruction, bowel perforation, diverticulitis, AAA, ischemic bowel, uti, kidney stone     Additional history obtained: Additional history obtained from spouse Records reviewed Primary Care Documents  Lab Tests: I Ordered, and personally interpreted labs.  The pertinent results include:  hematuria  Imaging Studies ordered: I ordered imaging studies including CT scan renal   I independently visualized and interpreted imaging which showed ureteral stone I agree with the radiologist interpretation  Medicines ordered and prescription drug management: I ordered medication including Vicodin for pain  Complexity of problems addressed: Patient's presentation is  most consistent with  acute presentation with potential threat to life or bodily function  Disposition: After consideration of the diagnostic results and the patient's response to treatment,  I feel that the patent would benefit from discharge   .   Patient found to have large ureteral stone.  He is essentially pain-free.  He is afebrile without any sign of infection Patient feels comfortable for discharge home.  He will be started on medical expulsive therapy with Flomax, follow-up with urology.  Also likely has BPH.  Patient agrees with  plan        Final Clinical Impression(s) / ED Diagnoses Final diagnoses:  Kidney stone    Rx / DC Orders ED Discharge Orders          Ordered    tamsulosin (FLOMAX) 0.4 MG CAPS capsule  Daily after supper        06/03/22 0552    HYDROcodone-acetaminophen (NORCO/VICODIN) 5-325 MG tablet  Every 6 hours PRN        06/03/22 8469              Zadie Rhine, MD 06/03/22 [phone]

## 2022-06-03 NOTE — ED Triage Notes (Addendum)
Right flank pain radiating to right abd.  Pain worse with eating  Denies urinary s/sy, n/v.  Tylenol @ 1830 with relief.

## 2022-06-05 ENCOUNTER — Ambulatory Visit: Payer: 59 | Admitting: Physical Therapy

## 2022-06-05 DIAGNOSIS — M5412 Radiculopathy, cervical region: Secondary | ICD-10-CM | POA: Diagnosis not present

## 2022-06-13 ENCOUNTER — Ambulatory Visit: Payer: Medicare Other | Admitting: Physician Assistant

## 2022-07-05 DIAGNOSIS — M5451 Vertebrogenic low back pain: Secondary | ICD-10-CM | POA: Diagnosis not present

## 2022-07-13 ENCOUNTER — Ambulatory Visit: Payer: Medicare Other | Attending: Orthopedic Surgery

## 2022-07-13 ENCOUNTER — Other Ambulatory Visit: Payer: Self-pay

## 2022-07-13 DIAGNOSIS — M5459 Other low back pain: Secondary | ICD-10-CM | POA: Diagnosis not present

## 2022-07-13 NOTE — Therapy (Signed)
OUTPATIENT PHYSICAL THERAPY THORACOLUMBAR EVALUATION   Patient Name: [name] MRN: [id number] DOB:1951/08/10, 71 y.o., male Today's Date: 07/13/2022   PT End of Session - 07/13/22 1109     Visit Number 1    Number of Visits 6    Date for PT Re-Evaluation 08/04/22    PT Start Time 1116    PT Stop Time 1155    PT Time Calculation (min) 39 min    Activity Tolerance Patient tolerated treatment well    Behavior During Therapy WFL for tasks assessed/performed             Past Medical History:  Diagnosis Date   Allergic rhinitis    Arthritis    Degenerative cervical disc    Diabetes mellitus without complication (HCC)    Dysrhythmia    hx tachycardia-ablation 2010   Esophageal stricture    GERD (gastroesophageal reflux disease)    hx-no meds now   PSVT (paroxysmal supraventricular tachycardia) (HCC)    Tinnitus    Wears glasses    Past Surgical History:  Procedure Laterality Date   APPENDECTOMY     CARDIAC CATHETERIZATION  2010   cardiac ablation   CARPAL TUNNEL RELEASE Left 07/10/2013   Procedure: CARPAL TUNNEL RELEASE;  Surgeon: Wyn Forster., MD;  Location: [facility];  Service: Orthopedics;  Laterality: Left;   INGUINAL HERNIA REPAIR  1956   left age 70   INGUINAL HERNIA REPAIR     right   ORIF WRIST FRACTURE  1975   bone graft-right   THYROGLOSSAL DUCT CYST  1963   TONSILLECTOMY     There are no problems to display for this patient.   PCP: Joycelyn Rua, MD  REFERRING PROVIDER: Venita Lick, MD  REFERRING DIAG: Vertebrogenic low back pain  Rationale for Evaluation and Treatment Rehabilitation  THERAPY DIAG:  Other low back pain  ONSET DATE: 1987  SUBJECTIVE:                                                                                                                                                                                           SUBJECTIVE STATEMENT: Patient reports that he has been having back pain  since 1987. He has noticed that his back pain will occasionally flare up, but electrical stimulation was able to completely resolve his pain. He was splitting wood yesterday which caused his back to ache. He notes that his pain will shift from side to side. He had tried going to a chiropractor before, but this never helped.  PERTINENT HISTORY:  Chronic neck pain  PAIN:  Are you having pain? Yes: NPRS  scale: can get up to 10 when aggravated/10 Pain location: low back  Pain description: ache and sore Aggravating factors: cutting wood, yard work Relieving factors: electrical stimulation   PRECAUTIONS: None  WEIGHT BEARING RESTRICTIONS No  FALLS:  Has patient fallen in last 6 months? No  LIVING ENVIRONMENT: Lives with: lives with their family Lives in: House/apartment Has following equipment at home: None  OCCUPATION: retired  PLOF: Independent  PATIENT GOALS learn exercises to help manage his pain when aggravated   OBJECTIVE:  SCREENING FOR RED FLAGS: Bowel or bladder incontinence: No Spinal tumors: No Cauda equina syndrome: No Compression fracture: No Abdominal aneurysm: No  COGNITION:  Overall cognitive status: Within functional limits for tasks assessed     SENSATION: WFL  POSTURE: forward head and flexed trunk   PALPATION: TTP: bilateral PSIS  JOINT MOBILITY:  Lumbar: WFL and nonpainful   LUMBAR ROM:   Active  A/PROM  eval  Flexion 54; slight recreation of his familiar pain  Extension 24  Right lateral flexion 25% limited  Left lateral flexion 25% limited   Right rotation WFL   Left rotation WFL   (Blank rows = not tested)  LOWER EXTREMITY ROM:     LOWER EXTREMITY MMT:    MMT Right eval Left eval  Hip flexion 4/5 4/5  Hip extension    Hip abduction    Hip adduction    Hip internal rotation    Hip external rotation    Knee flexion 4/5 4/5  Knee extension 4+/5 4+/5  Ankle dorsiflexion    Ankle plantarflexion    Ankle inversion    Ankle  eversion     (Blank rows = not tested)  LUMBAR SPECIAL TESTS:  Straight leg raise test: Negative  GAIT: Assistive device utilized: None Level of assistance: Complete Independence Comments: WFL  TODAY'S TREATMENT                                    9/14 EXERCISE LOG  Exercise Repetitions and Resistance Comments  LTR 10 reps   Bridge 7 reps   Seated HS stretch 1 x 30 seconds   Self STM          Blank cell = exercise not performed today    PATIENT EDUCATION:  Education details: POC, healing, prognosis Person educated: Patient Education method: Explanation Education comprehension: verbalized understanding   HOME EXERCISE PROGRAM: See today's interventions  ASSESSMENT:  CLINICAL IMPRESSION: Patient is a 71 y.o. male who was seen today for physical therapy evaluation and treatment for chronic low back pain. He presented with low pain severity and irritability with lumbar flexion slightly reproducing his familiar pain. Recommend that he continue with skilled physical therapy to address his impairments to maximize his functional mobility.    OBJECTIVE IMPAIRMENTS decreased activity tolerance, decreased ROM, decreased strength, and pain.   ACTIVITY LIMITATIONS lifting and bending  PARTICIPATION LIMITATIONS: yard work  PERSONAL FACTORS Time since onset of injury/illness/exacerbation and 1-2 comorbidities: OA, DM  are also affecting patient's functional outcome.   REHAB POTENTIAL: Good  CLINICAL DECISION MAKING: Stable/uncomplicated  EVALUATION COMPLEXITY: Low   GOALS: Goals reviewed with patient? No  LONG TERM GOALS: Target date: 08/03/2022  Patient will be independent with his HEP.  Baseline:  Goal status: INITIAL  2.  Patient will be able to complete his daily activities without his familiar pain exceeding a 5/10.  Baseline:  Goal status: INITIAL  3.  Patient will be able to demonstrate at least 60 degrees of lumbar flexion without being limited by his  familiar pain.  Baseline:  Goal status: INITIAL  PLAN: PT FREQUENCY: 2x/week  PT DURATION: 3 weeks  PLANNED INTERVENTIONS: Therapeutic exercises, Therapeutic activity, Neuromuscular re-education, Patient/Family education, Joint mobilization, Dry Needling, Electrical stimulation, Spinal mobilization, Cryotherapy, Moist heat, Traction, Manual therapy, and Re-evaluation.  PLAN FOR NEXT SESSION: recumbent bike, lumbar strengthening, and modalities as needed   Granville Lewis, PT 07/13/2022, 5:35 PM

## 2022-07-17 ENCOUNTER — Encounter: Payer: Self-pay | Admitting: Physical Therapy

## 2022-07-17 ENCOUNTER — Ambulatory Visit: Payer: Medicare Other | Admitting: Physical Therapy

## 2022-07-17 DIAGNOSIS — M5459 Other low back pain: Secondary | ICD-10-CM | POA: Diagnosis not present

## 2022-07-17 NOTE — Therapy (Signed)
OUTPATIENT PHYSICAL THERAPY THORACOLUMBAR EVALUATION   Patient Name: [name] MRN: [id number] DOB:Apr 11, 1951, 71 y.o., male Today's Date: 07/17/2022   PT End of Session - 07/17/22 1108     Visit Number 2    Number of Visits 6    Date for PT Re-Evaluation 08/04/22    PT Start Time 1032             Past Medical History:  Diagnosis Date   Allergic rhinitis    Arthritis    Degenerative cervical disc    Diabetes mellitus without complication (HCC)    Dysrhythmia    hx tachycardia-ablation 2010   Esophageal stricture    GERD (gastroesophageal reflux disease)    hx-no meds now   PSVT (paroxysmal supraventricular tachycardia) (HCC)    Tinnitus    Wears glasses    Past Surgical History:  Procedure Laterality Date   APPENDECTOMY     CARDIAC CATHETERIZATION  2010   cardiac ablation   CARPAL TUNNEL RELEASE Left 07/10/2013   Procedure: CARPAL TUNNEL RELEASE;  Surgeon: Wyn Forster., MD;  Location: [facility];  Service: Orthopedics;  Laterality: Left;   INGUINAL HERNIA REPAIR  1956   left age 50   INGUINAL HERNIA REPAIR     right   ORIF WRIST FRACTURE  1975   bone graft-right   THYROGLOSSAL DUCT CYST  1963   TONSILLECTOMY     There are no problems to display for this patient.   PCP: Joycelyn Rua, MD  REFERRING PROVIDER: Venita Lick, MD  REFERRING DIAG: Vertebrogenic low back pain  Rationale for Evaluation and Treatment Rehabilitation  THERAPY DIAG:  Other low back pain  ONSET DATE: 1987  SUBJECTIVE:                                                                                                                                                                                           SUBJECTIVE STATEMENT: Pain about a 3 to 4 today.  Patient had a small scratch in his left low back region. PERTINENT HISTORY:  Chronic neck pain  PAIN:    PATIENT GOALS learn exercises to help manage his pain when aggravated   OBJECTIVE:    TODAY'S TREATMENT       Patient in left sdly position with folded pillow between knees for comfort.  Combo e'stim/US at 1.50 W/CM2 x 12 minutes to patient's affected lower lumbar region f/b STW/M x 11 minutes f/b HMP and IFC at 80-150 Hz on 40% scan x 20 minutes.  Patient tolerated treatment well without complaint with normal modality response following removal of modality.  PATIENT EDUCATION:  Education details: POC, healing, prognosis Person educated: Patient Education method: Explanation Education comprehension: verbalized understanding   HOME EXERCISE PROGRAM: SINGLE KNEE TO CHEST STRETCH - SKTC While Lying on your back, hold your knee and gently pull it up towards your chest. Repeat 3 Times Hold 30 Seconds Complete 1 Set Perform 3 Times a Day Double Knee to chest stretch Pull your knees up to your chest hold for 30 seconds then relax. Repeat Continue to breathe throughout stretch. Repeat 2 Times Hold 30 Seconds Complete 3 Sets Perform 2 Times a Day Bridges While laying on your back, contract the low abdominals and lift from the hips. Repeat 15 Times Hold 2 Seconds Complete 2 Sets Perform 2 Times a Day ASSESSMENT:  CLINICAL IMPRESSION:  Patient did well with treatment today. Added S and DKTC and hip bridges which her performed with excellent technique.   EVALUATION COMPLEXITY: Low   GOALS: Goals reviewed with patient? No  LONG TERM GOALS: Target date: 08/03/2022  Patient will be independent with his HEP.  Baseline:  Goal status: INITIAL  2.  Patient will be able to complete his daily activities without his familiar pain exceeding a 5/10.  Baseline:  Goal status: INITIAL  3.  Patient will be able to demonstrate at least 60 degrees of lumbar flexion without being limited by his familiar pain.  Baseline:  Goal status: INITIAL  PLAN: PT FREQUENCY: 2x/week  PT DURATION: 3 weeks  PLANNED INTERVENTIONS: Therapeutic exercises,  Therapeutic activity, Neuromuscular re-education, Patient/Family education, Joint mobilization, Dry Needling, Electrical stimulation, Spinal mobilization, Cryotherapy, Moist heat, Traction, Manual therapy, and Re-evaluation.  PLAN FOR NEXT SESSION: recumbent bike, lumbar strengthening, and modalities as needed   Estell Puccini, [place], PT 07/17/2022, 12:13 PM

## 2022-07-20 ENCOUNTER — Ambulatory Visit: Payer: Medicare Other

## 2022-07-20 DIAGNOSIS — M5459 Other low back pain: Secondary | ICD-10-CM | POA: Diagnosis not present

## 2022-07-20 NOTE — Therapy (Signed)
OUTPATIENT PHYSICAL THERAPY THORACOLUMBAR TREATMENT   Patient Name: Anil Claytor MRN: [id number] DOB:11/10/1950, 71 y.o., male Today's Date: 07/20/2022   PT End of Session - 07/20/22 1051     Visit Number 3    Number of Visits 6    Date for PT Re-Evaluation 08/04/22    PT Start Time 1031    PT Stop Time 1108    PT Time Calculation (min) 37 min    Activity Tolerance Patient tolerated treatment well    Behavior During Therapy WFL for tasks assessed/performed              Past Medical History:  Diagnosis Date   Allergic rhinitis    Arthritis    Degenerative cervical disc    Diabetes mellitus without complication (HCC)    Dysrhythmia    hx tachycardia-ablation 2010   Esophageal stricture    GERD (gastroesophageal reflux disease)    hx-no meds now   PSVT (paroxysmal supraventricular tachycardia) (HCC)    Tinnitus    Wears glasses    Past Surgical History:  Procedure Laterality Date   APPENDECTOMY     CARDIAC CATHETERIZATION  2010   cardiac ablation   CARPAL TUNNEL RELEASE Left 07/10/2013   Procedure: CARPAL TUNNEL RELEASE;  Surgeon: Wyn Forster., MD;  Location: [facility];  Service: Orthopedics;  Laterality: Left;   INGUINAL HERNIA REPAIR  1956   left age 19   INGUINAL HERNIA REPAIR     right   ORIF WRIST FRACTURE  1975   bone graft-right   THYROGLOSSAL DUCT CYST  1963   TONSILLECTOMY     There are no problems to display for this patient.   PCP: Joycelyn Rua, MD  REFERRING PROVIDER: Venita Lick, MD  REFERRING DIAG: Vertebrogenic low back pain  Rationale for Evaluation and Treatment Rehabilitation  THERAPY DIAG:  Other low back pain  ONSET DATE: 1987  SUBJECTIVE:                                                                                                                                                                                           SUBJECTIVE STATEMENT: Patient reports that he felt good after his last  appointment and his HEP feels good. He feels like today will be his last appointment.  PERTINENT HISTORY:  Chronic neck pain  PAIN:    PATIENT GOALS learn exercises to help manage his pain when aggravated   OBJECTIVE:   TODAY'S TREATMENT  9/21 EXERCISE LOG  Exercise Repetitions and Resistance Comments  Resisted pull downs  Blue XTS x 2 minutes   Resisted wood chops  Blue XTS x 2 minutes each    Thomas stretch  4 x 30 seconds each   Standing hip flexor stretch 2 x 30 seconds   Ball roll out (multidirectional) 3 minutes   Lumbar rotation Blue t-band x 2 minutes    Blank cell = exercise not performed today                                  PATIENT EDUCATION:  Education details: POC, healing, prognosis Person educated: Patient Education method: Explanation Education comprehension: verbalized understanding   HOME EXERCISE PROGRAM: [id number] ASSESSMENT:  CLINICAL IMPRESSION:   Patient presented to treatment reporting feeling comfortable with his HEP and felt ready to be discharged at this time. His HEP was updated and he was able to properly demonstrate these interventions. He reported feeling comfortable with these additions and being discharged at this time.   PHYSICAL THERAPY DISCHARGE SUMMARY  Visits from Start of Care: 3  Current functional level related to goals / functional outcomes: Patient was able to meet all his goals for therapy and being discharged at this time.    Remaining deficits: None   Education / Equipment: HEP   Patient agrees to discharge. Patient goals were met. Patient is being discharged due to meeting the stated rehab goals.   EVALUATION COMPLEXITY: Low   GOALS: Goals reviewed with patient? No  LONG TERM GOALS: Target date: 08/03/2022  Patient will be independent with his HEP.  Baseline:  Goal status: MET  2.  Patient will be able to complete his daily activities without his familiar pain exceeding a  5/10.  Baseline:  Goal status: MET  3.  Patient will be able to demonstrate at least 60 degrees of lumbar flexion without being limited by his familiar pain.  Baseline:  Goal status: MET  PLAN: PT FREQUENCY: 2x/week  PT DURATION: 3 weeks  PLANNED INTERVENTIONS: Therapeutic exercises, Therapeutic activity, Neuromuscular re-education, Patient/Family education, Joint mobilization, Dry Needling, Electrical stimulation, Spinal mobilization, Cryotherapy, Moist heat, Traction, Manual therapy, and Re-evaluation.  PLAN FOR NEXT SESSION: recumbent bike, lumbar strengthening, and modalities as needed   Granville Lewis, PT 07/20/2022, 11:38 AM

## 2022-08-19 DIAGNOSIS — R197 Diarrhea, unspecified: Secondary | ICD-10-CM | POA: Diagnosis not present

## 2022-11-22 DIAGNOSIS — Z0000 Encounter for general adult medical examination without abnormal findings: Secondary | ICD-10-CM | POA: Diagnosis not present

## 2022-11-22 DIAGNOSIS — E119 Type 2 diabetes mellitus without complications: Secondary | ICD-10-CM | POA: Diagnosis not present

## 2022-11-22 DIAGNOSIS — M19041 Primary osteoarthritis, right hand: Secondary | ICD-10-CM | POA: Diagnosis not present

## 2022-11-22 DIAGNOSIS — E7800 Pure hypercholesterolemia, unspecified: Secondary | ICD-10-CM | POA: Diagnosis not present

## 2022-11-22 DIAGNOSIS — Z79899 Other long term (current) drug therapy: Secondary | ICD-10-CM | POA: Diagnosis not present

## 2022-11-22 DIAGNOSIS — R062 Wheezing: Secondary | ICD-10-CM | POA: Diagnosis not present

## 2022-11-22 DIAGNOSIS — E1169 Type 2 diabetes mellitus with other specified complication: Secondary | ICD-10-CM | POA: Diagnosis not present

## 2022-11-22 DIAGNOSIS — I700 Atherosclerosis of aorta: Secondary | ICD-10-CM | POA: Diagnosis not present

## 2022-11-22 DIAGNOSIS — I471 Supraventricular tachycardia, unspecified: Secondary | ICD-10-CM | POA: Diagnosis not present

## 2023-03-05 ENCOUNTER — Telehealth: Payer: Self-pay

## 2023-03-05 NOTE — Telephone Encounter (Signed)
Dr. Annabell Howells has an openings Thursday.

## 2023-03-05 NOTE — Telephone Encounter (Unsigned)
New patient continues to have pain with kidney stones. Requesting if he could possibly be seen this week?    Call back:  [phone]    Please advise.

## 2023-03-06 NOTE — Telephone Encounter (Signed)
The patients wife called back and took the 10:45 appointment time with Dr. Annabell Howells on 5/9.

## 2023-03-08 ENCOUNTER — Encounter: Payer: Self-pay | Admitting: Urology

## 2023-03-08 ENCOUNTER — Ambulatory Visit (HOSPITAL_COMMUNITY)
Admission: RE | Admit: 2023-03-08 | Discharge: 2023-03-08 | Disposition: A | Discharge status: Home / Self Care | Payer: Medicare Other | Source: Ambulatory Visit | Attending: Urology | Admitting: Urology

## 2023-03-08 ENCOUNTER — Telehealth: Payer: Self-pay

## 2023-03-08 ENCOUNTER — Ambulatory Visit: Payer: Medicare Other | Admitting: Urology

## 2023-03-08 ENCOUNTER — Other Ambulatory Visit: Payer: Self-pay

## 2023-03-08 VITALS — BP 138/82 | HR 51 | Ht 72.0 in | Wt 205.0 lb

## 2023-03-08 DIAGNOSIS — N201 Calculus of ureter: Secondary | ICD-10-CM | POA: Diagnosis not present

## 2023-03-08 DIAGNOSIS — R3915 Urgency of urination: Secondary | ICD-10-CM | POA: Diagnosis not present

## 2023-03-08 DIAGNOSIS — R1031 Right lower quadrant pain: Secondary | ICD-10-CM | POA: Diagnosis not present

## 2023-03-08 DIAGNOSIS — R3129 Other microscopic hematuria: Secondary | ICD-10-CM | POA: Diagnosis not present

## 2023-03-08 DIAGNOSIS — N200 Calculus of kidney: Secondary | ICD-10-CM | POA: Diagnosis not present

## 2023-03-08 LAB — URINALYSIS, ROUTINE W REFLEX MICROSCOPIC
Bilirubin, UA: NEGATIVE
Glucose, UA: NEGATIVE
Ketones, UA: NEGATIVE
Leukocytes,UA: NEGATIVE
Nitrite, UA: NEGATIVE
Protein,UA: NEGATIVE
Specific Gravity, UA: 1.01 (ref 1.005–1.030)
Urobilinogen, Ur: 0.2 mg/dL (ref 0.2–1.0)
pH, UA: 6 (ref 5.0–7.5)

## 2023-03-08 LAB — MICROSCOPIC EXAMINATION: Bacteria, UA: NONE SEEN

## 2023-03-08 NOTE — Telephone Encounter (Addendum)
I spoke with patient's wife and made her aware of the need for the KUB prior to her apt.  She states she was told the apt was at 245pm.  I informed her that he is scheduled for 1045, I offered patient a later apt time.  Patient rescheduled for 2p with imaging prior.

## 2023-03-08 NOTE — Telephone Encounter (Signed)
I left vm to patient to return call.  Dr. Annabell Howells would like pt to have a KUB prior to apt.  Orders are in, patient should arrive at [facility] radiology department.  Need updated imaging.

## 2023-03-08 NOTE — H&P (View-Only) (Signed)
Subjective: 1. Kidney stones   2. Right lower quadrant abdominal pain   3. Urgency of urination   4. Microhematuria      [name] is a 72 yo male who had a right ureteral stone in the proximal ureter in 8/23. He didn't pass it but the pain improved and he now reports the onset Monday of recurrent pain in the right lower quadrant with radiation into the right testicle.  He has no frequency or urgency.  He has had no hematuria.  His IPSS is 10 with urgency and frequency but that is chronic.  He has some tingling in the penis.  He has had no prior GU surgery, UTI's or stones.  He has ED and penile curvature.  He has 3-10 RBC's on UA today.  ROS:  Review of Systems  Gastrointestinal:  Positive for abdominal pain.  Genitourinary:  Positive for flank pain, frequency and urgency.  All other systems reviewed and are negative.   Allergies  Allergen Reactions   Barium Sulfate Other (See Comments)    Other reaction(s): hot flashes and vomiting    Barium-Containing Compounds    Barium Iodide Nausea And Vomiting    Past Medical History:  Diagnosis Date   Allergic rhinitis    Arthritis    Degenerative cervical disc    Diabetes mellitus without complication (HCC)    Dysrhythmia    hx tachycardia-ablation 2010   Esophageal stricture    GERD (gastroesophageal reflux disease)    hx-no meds now   PSVT (paroxysmal supraventricular tachycardia)    Tinnitus    Wears glasses     Past Surgical History:  Procedure Laterality Date   APPENDECTOMY     CARDIAC CATHETERIZATION  2010   cardiac ablation   CARPAL TUNNEL RELEASE Left 07/10/2013   Procedure: CARPAL TUNNEL RELEASE;  Surgeon: Robert V Sypher Jr., MD;  Location: [facility];  Service: Orthopedics;  Laterality: Left;   INGUINAL HERNIA REPAIR  1956   left age 4   INGUINAL HERNIA REPAIR     right   ORIF WRIST FRACTURE  1975   bone graft-right   THYROGLOSSAL DUCT CYST  1963   TONSILLECTOMY      Social History    Socioeconomic History   Marital status: Married    Spouse name: Not on file   Number of children: Not on file   Years of education: Not on file   Highest education level: Not on file  Occupational History   Not on file  Tobacco Use   Smoking status: Former    Types: Cigarettes    Quit date: 07/10/1979    Years since quitting: 43.6   Smokeless tobacco: Not on file  Substance and Sexual Activity   Alcohol use: Yes    Comment: daily beer or alcohol   Drug use: No   Sexual activity: Not on file  Other Topics Concern   Not on file  Social History Narrative   Not on file   Social Determinants of Health   Financial Resource Strain: Not on file  Food Insecurity: Not on file  Transportation Needs: Not on file  Physical Activity: Not on file  Stress: Not on file  Social Connections: Not on file  Intimate Partner Violence: Not on file    History reviewed. No pertinent family history.  Anti-infectives: Anti-infectives (From admission, onward)    None       Current Outpatient Medications  Medication Sig Dispense Refill   aspirin 81 MG   tablet Take 81 mg by mouth daily.     cyclobenzaprine (FLEXERIL) 10 MG tablet Take 10 mg by mouth 3 (three) times daily as needed for muscle spasms.     HYDROcodone-acetaminophen (NORCO/VICODIN) 5-325 MG tablet Take 1 tablet by mouth every 6 (six) hours as needed for severe pain. 10 tablet 0   naproxen (NAPROSYN) 500 MG tablet Take 500 mg by mouth 2 (two) times daily as needed (only uses in evening).     omeprazole (PRILOSEC) 20 MG capsule Take 20 mg by mouth daily.     tamsulosin (FLOMAX) 0.4 MG CAPS capsule Take 1 capsule (0.4 mg total) by mouth daily after supper. 14 capsule 0   No current facility-administered medications for this visit.     Objective: Vital signs in last 24 hours: BP 138/82   Pulse (!) 51   Ht 6' (1.829 m)   Wt 205 lb (93 kg)   BMI 27.80 kg/m   Intake/Output from previous day: No intake/output data  recorded. Intake/Output this shift: @IOTHISSHIFT@   Physical Exam Vitals reviewed.  Constitutional:      Appearance: Normal appearance.  Cardiovascular:     Rate and Rhythm: Normal rate and regular rhythm.  Pulmonary:     Effort: Pulmonary effort is normal. No respiratory distress.     Breath sounds: Normal breath sounds.  Abdominal:     General: Abdomen is flat.     Palpations: Abdomen is soft.     Tenderness: There is abdominal tenderness. There is right CVA tenderness.  Genitourinary:    Penis: Normal.      Testes: Normal.  Musculoskeletal:        General: No swelling or tenderness. Normal range of motion.  Skin:    General: Skin is warm and dry.  Neurological:     General: No focal deficit present.     Mental Status: He is alert and oriented to person, place, and time.  Psychiatric:        Mood and Affect: Mood normal.        Behavior: Behavior normal.     Lab Results:  Results for orders placed or performed in visit on 03/08/23 (from the past 24 hour(s))  Urinalysis, Routine w reflex microscopic     Status: Abnormal   Collection Time: 03/08/23  1:34 PM  Result Value Ref Range   Specific Gravity, UA 1.010 1.005 - 1.030   pH, UA 6.0 5.0 - 7.5   Color, UA Yellow Yellow   Appearance Ur Clear Clear   Leukocytes,UA Negative Negative   Protein,UA Negative Negative/Trace   Glucose, UA Negative Negative   Ketones, UA Negative Negative   RBC, UA 1+ (A) Negative   Bilirubin, UA Negative Negative   Urobilinogen, Ur 0.2 0.2 - 1.0 mg/dL   Nitrite, UA Negative Negative   Microscopic Examination See below:    Narrative   Performed at:  01 - [facility] [address], [place], [place]  [id number] Lab Director: Lorie South MT, Phone:  [phone]  Microscopic Examination     Status: Abnormal   Collection Time: 03/08/23  1:34 PM   Urine  Result Value Ref Range   WBC, UA 0-5 0 - 5 /hpf   RBC, Urine 3-10 (A) 0 - 2 /hpf   Epithelial Cells (non renal) 0-10 0 -  10 /hpf   Bacteria, UA None seen None seen/Few   Narrative   Performed at:  01 - [facility] [address], [place], [place]    [id number] Lab Director: Lorie South MT, Phone:  [phone]    BMET No results for input(s): "NA", "K", "CL", "CO2", "GLUCOSE", "BUN", "CREATININE", "CALCIUM" in the last 72 hours. PT/INR No results for input(s): "LABPROT", "INR" in the last 72 hours. ABG No results for input(s): "PHART", "HCO3" in the last 72 hours.  Invalid input(s): "PCO2", "PO2" UA 3-10 RBC's. Studies/Results: CT RENAL STONE STUDY  Result Date: 03/09/2023 CLINICAL DATA:  Abdominal/flank pain.  Evaluate for kidney stone. EXAM: CT ABDOMEN AND PELVIS WITHOUT CONTRAST TECHNIQUE: Multidetector CT imaging of the abdomen and pelvis was performed following the standard protocol without IV contrast. RADIATION DOSE REDUCTION: This exam was performed according to the departmental dose-optimization program which includes automated exposure control, adjustment of the mA and/or kV according to patient size and/or use of iterative reconstruction technique. COMPARISON:  06/03/2022 FINDINGS: Lower chest: No pleural effusion or airspace consolidation. Hepatobiliary: Hepatic steatosis. Gallbladder appears within normal limits. No bile duct dilatation. Pancreas: Unremarkable. No pancreatic ductal dilatation or surrounding inflammatory changes. Spleen: Normal in size without focal abnormality. Adrenals/Urinary Tract: Normal adrenal glands. Bilateral kidney cysts are identified. The largest arises off the anterior cortex of the right kidney measuring 2.6 cm. There is right-sided pelvocaliectasis and proximal hydroureter. Within the right ureter there is a 8 mm stone just beyond the bifurcation of the right common iliac artery, image 63/2. No left renal calculi, hydronephrosis or ureteral calculi. Bladder appears normal. Stomach/Bowel: Normal appearance of the stomach. The appendix is not visualized. No bowel  wall thickening, inflammation, or distension. Sigmoid diverticulosis without signs of acute diverticulitis. Vascular/Lymphatic: Aortic atherosclerosis. No signs of abdominopelvic adenopathy. Reproductive: Prostate is unremarkable. Other: Small fat containing left inguinal hernia. No free fluid or fluid collections. No signs of pneumoperitoneum. Musculoskeletal: No acute or significant osseous findings. Lumbar degenerative disc disease. IMPRESSION: 1. Right-sided pelvocaliectasis and proximal hydroureter secondary to 8 mm stone in the right ureter just beyond the bifurcation of the right common iliac artery. 2. Hepatic steatosis. 3. Sigmoid diverticulosis without signs of acute diverticulitis. 4. Small fat containing left inguinal hernia. 5.  Aortic Atherosclerosis (ICD10-I70.0). Electronically Signed   By: Taylor  Stroud M.D.   On: 03/09/2023 07:15   KUB reviewed.  Possible 9mm stone over right sacrum but it is not definitely a stone.  Final report pending.   Prior CT films reviewed.  Assessment/Plan: Right ureteral stone with pain and hematuria.   I will get a CT stone study  to try to better assess the stone.   That will be done tomorrow and I will call with the results.   No orders of the defined types were placed in this encounter.    Orders Placed This Encounter  Procedures   Microscopic Examination   CT RENAL STONE STUDY    Order Specific Question:   Preferred imaging location?    Answer:   [facility]    Order Specific Question:   Radiology Contrast Protocol - do NOT remove file path    Answer:   [email or web address]   Urinalysis, Routine w reflex microscopic     Return for I will arrange f/u based on the CT findings. .    CC: Dr. Stephen Meyers.      [name] 03/09/2023  

## 2023-03-08 NOTE — Progress Notes (Signed)
Subjective: 1. Kidney stones   2. Right lower quadrant abdominal pain   3. Urgency of urination   4. Microhematuria      [name] is a 72 yo male who had a right ureteral stone in the proximal ureter in 8/23. He didn't pass it but the pain improved and he now reports the onset Monday of recurrent pain in the right lower quadrant with radiation into the right testicle.  He has no frequency or urgency.  He has had no hematuria.  His IPSS is 10 with urgency and frequency but that is chronic.  He has some tingling in the penis.  He has had no prior GU surgery, UTI's or stones.  He has ED and penile curvature.  He has 3-10 RBC's on UA today.  ROS:  Review of Systems  Gastrointestinal:  Positive for abdominal pain.  Genitourinary:  Positive for flank pain, frequency and urgency.  All other systems reviewed and are negative.   Allergies  Allergen Reactions   Barium Sulfate Other (See Comments)    Other reaction(s): hot flashes and vomiting    Barium-Containing Compounds    Barium Iodide Nausea And Vomiting    Past Medical History:  Diagnosis Date   Allergic rhinitis    Arthritis    Degenerative cervical disc    Diabetes mellitus without complication (HCC)    Dysrhythmia    hx tachycardia-ablation 2010   Esophageal stricture    GERD (gastroesophageal reflux disease)    hx-no meds now   PSVT (paroxysmal supraventricular tachycardia)    Tinnitus    Wears glasses     Past Surgical History:  Procedure Laterality Date   APPENDECTOMY     CARDIAC CATHETERIZATION  2010   cardiac ablation   CARPAL TUNNEL RELEASE Left 07/10/2013   Procedure: CARPAL TUNNEL RELEASE;  Surgeon: Wyn Forster., MD;  Location: [facility];  Service: Orthopedics;  Laterality: Left;   INGUINAL HERNIA REPAIR  1956   left age 39   INGUINAL HERNIA REPAIR     right   ORIF WRIST FRACTURE  1975   bone graft-right   THYROGLOSSAL DUCT CYST  1963   TONSILLECTOMY      Social History    Socioeconomic History   Marital status: Married    Spouse name: Not on file   Number of children: Not on file   Years of education: Not on file   Highest education level: Not on file  Occupational History   Not on file  Tobacco Use   Smoking status: Former    Types: Cigarettes    Quit date: 07/10/1979    Years since quitting: 43.6   Smokeless tobacco: Not on file  Substance and Sexual Activity   Alcohol use: Yes    Comment: daily beer or alcohol   Drug use: No   Sexual activity: Not on file  Other Topics Concern   Not on file  Social History Narrative   Not on file   Social Determinants of Health   Financial Resource Strain: Not on file  Food Insecurity: Not on file  Transportation Needs: Not on file  Physical Activity: Not on file  Stress: Not on file  Social Connections: Not on file  Intimate Partner Violence: Not on file    History reviewed. No pertinent family history.  Anti-infectives: Anti-infectives (From admission, onward)    None       Current Outpatient Medications  Medication Sig Dispense Refill   aspirin 81 MG  tablet Take 81 mg by mouth daily.     cyclobenzaprine (FLEXERIL) 10 MG tablet Take 10 mg by mouth 3 (three) times daily as needed for muscle spasms.     HYDROcodone-acetaminophen (NORCO/VICODIN) 5-325 MG tablet Take 1 tablet by mouth every 6 (six) hours as needed for severe pain. 10 tablet 0   naproxen (NAPROSYN) 500 MG tablet Take 500 mg by mouth 2 (two) times daily as needed (only uses in evening).     omeprazole (PRILOSEC) 20 MG capsule Take 20 mg by mouth daily.     tamsulosin (FLOMAX) 0.4 MG CAPS capsule Take 1 capsule (0.4 mg total) by mouth daily after supper. 14 capsule 0   No current facility-administered medications for this visit.     Objective: Vital signs in last 24 hours: BP 138/82   Pulse (!) 51   Ht 6' (1.829 m)   Wt 205 lb (93 kg)   BMI 27.80 kg/m   Intake/Output from previous day: No intake/output data  recorded. Intake/Output this shift: @IOTHISSHIFT @   Physical Exam Vitals reviewed.  Constitutional:      Appearance: Normal appearance.  Cardiovascular:     Rate and Rhythm: Normal rate and regular rhythm.  Pulmonary:     Effort: Pulmonary effort is normal. No respiratory distress.     Breath sounds: Normal breath sounds.  Abdominal:     General: Abdomen is flat.     Palpations: Abdomen is soft.     Tenderness: There is abdominal tenderness. There is right CVA tenderness.  Genitourinary:    Penis: Normal.      Testes: Normal.  Musculoskeletal:        General: No swelling or tenderness. Normal range of motion.  Skin:    General: Skin is warm and dry.  Neurological:     General: No focal deficit present.     Mental Status: He is alert and oriented to person, place, and time.  Psychiatric:        Mood and Affect: Mood normal.        Behavior: Behavior normal.     Lab Results:  Results for orders placed or performed in visit on 03/08/23 (from the past 24 hour(s))  Urinalysis, Routine w reflex microscopic     Status: Abnormal   Collection Time: 03/08/23  1:34 PM  Result Value Ref Range   Specific Gravity, UA 1.010 1.005 - 1.030   pH, UA 6.0 5.0 - 7.5   Color, UA Yellow Yellow   Appearance Ur Clear Clear   Leukocytes,UA Negative Negative   Protein,UA Negative Negative/Trace   Glucose, UA Negative Negative   Ketones, UA Negative Negative   RBC, UA 1+ (A) Negative   Bilirubin, UA Negative Negative   Urobilinogen, Ur 0.2 0.2 - 1.0 mg/dL   Nitrite, UA Negative Negative   Microscopic Examination See below:    Narrative   Performed at:  [address] - [facility] [address], [place], [place]  [id number] Lab Director: Chinita Pester MT, Phone:  [phone]  Microscopic Examination     Status: Abnormal   Collection Time: 03/08/23  1:34 PM   Urine  Result Value Ref Range   WBC, UA 0-5 0 - 5 /hpf   RBC, Urine 3-10 (A) 0 - 2 /hpf   Epithelial Cells (non renal) 0-10 0 -  10 /hpf   Bacteria, UA None seen None seen/Few   Narrative   Performed at:  01 - [facility] [address] [place], [place]  [id number] Lab Director: Chinita Pester MT, Phone:  [phone]    BMET No results for input(s): "NA", "K", "CL", "CO2", "GLUCOSE", "BUN", "CREATININE", "CALCIUM" in the last 72 hours. PT/INR No results for input(s): "LABPROT", "INR" in the last 72 hours. ABG No results for input(s): "PHART", "HCO3" in the last 72 hours.  Invalid input(s): "PCO2", "PO2" UA 3-10 RBC's. Studies/Results: CT RENAL STONE STUDY  Result Date: 03/09/2023 CLINICAL DATA:  Abdominal/flank pain.  Evaluate for kidney stone. EXAM: CT ABDOMEN AND PELVIS WITHOUT CONTRAST TECHNIQUE: Multidetector CT imaging of the abdomen and pelvis was performed following the standard protocol without IV contrast. RADIATION DOSE REDUCTION: This exam was performed according to the departmental dose-optimization program which includes automated exposure control, adjustment of the mA and/or kV according to patient size and/or use of iterative reconstruction technique. COMPARISON:  06/03/2022 FINDINGS: Lower chest: No pleural effusion or airspace consolidation. Hepatobiliary: Hepatic steatosis. Gallbladder appears within normal limits. No bile duct dilatation. Pancreas: Unremarkable. No pancreatic ductal dilatation or surrounding inflammatory changes. Spleen: Normal in size without focal abnormality. Adrenals/Urinary Tract: Normal adrenal glands. Bilateral kidney cysts are identified. The largest arises off the anterior cortex of the right kidney measuring 2.6 cm. There is right-sided pelvocaliectasis and proximal hydroureter. Within the right ureter there is a 8 mm stone just beyond the bifurcation of the right common iliac artery, image 63/2. No left renal calculi, hydronephrosis or ureteral calculi. Bladder appears normal. Stomach/Bowel: Normal appearance of the stomach. The appendix is not visualized. No bowel  wall thickening, inflammation, or distension. Sigmoid diverticulosis without signs of acute diverticulitis. Vascular/Lymphatic: Aortic atherosclerosis. No signs of abdominopelvic adenopathy. Reproductive: Prostate is unremarkable. Other: Small fat containing left inguinal hernia. No free fluid or fluid collections. No signs of pneumoperitoneum. Musculoskeletal: No acute or significant osseous findings. Lumbar degenerative disc disease. IMPRESSION: 1. Right-sided pelvocaliectasis and proximal hydroureter secondary to 8 mm stone in the right ureter just beyond the bifurcation of the right common iliac artery. 2. Hepatic steatosis. 3. Sigmoid diverticulosis without signs of acute diverticulitis. 4. Small fat containing left inguinal hernia. 5.  Aortic Atherosclerosis (ICD10-I70.0). Electronically Signed   By: Signa Kell M.D.   On: 03/09/2023 07:15   KUB reviewed.  Possible 9mm stone over right sacrum but it is not definitely a stone.  Final report pending.   Prior CT films reviewed.  Assessment/Plan: Right ureteral stone with pain and hematuria.   I will get a CT stone study  to try to better assess the stone.   That will be done tomorrow and I will call with the results.   No orders of the defined types were placed in this encounter.    Orders Placed This Encounter  Procedures   Microscopic Examination   CT RENAL STONE STUDY    Order Specific Question:   Preferred imaging location?    Answer:   [facility]    Order Specific Question:   Radiology Contrast Protocol - do NOT remove file path    Answer:   [email or web address]   Urinalysis, Routine w reflex microscopic     Return for I will arrange f/u based on the CT findings. .    CC: Dr. Joycelyn Rua.      Bjorn Pippin 03/09/2023

## 2023-03-09 ENCOUNTER — Ambulatory Visit (HOSPITAL_COMMUNITY)
Admission: RE | Admit: 2023-03-09 | Discharge: 2023-03-09 | Disposition: A | Discharge status: Home / Self Care | Payer: Medicare Other | Source: Ambulatory Visit | Attending: Urology | Admitting: Urology

## 2023-03-09 DIAGNOSIS — K760 Fatty (change of) liver, not elsewhere classified: Secondary | ICD-10-CM | POA: Diagnosis not present

## 2023-03-09 DIAGNOSIS — R1031 Right lower quadrant pain: Secondary | ICD-10-CM | POA: Diagnosis not present

## 2023-03-09 DIAGNOSIS — K5730 Diverticulosis of large intestine without perforation or abscess without bleeding: Secondary | ICD-10-CM | POA: Diagnosis not present

## 2023-03-09 DIAGNOSIS — N132 Hydronephrosis with renal and ureteral calculous obstruction: Secondary | ICD-10-CM | POA: Diagnosis not present

## 2023-03-11 ENCOUNTER — Other Ambulatory Visit: Payer: Self-pay

## 2023-03-11 ENCOUNTER — Emergency Department (HOSPITAL_COMMUNITY)
Admission: EM | Admit: 2023-03-11 | Discharge: 2023-03-12 | Disposition: A | Discharge status: Home / Self Care | Payer: Medicare Other | Attending: Emergency Medicine | Admitting: Emergency Medicine

## 2023-03-11 DIAGNOSIS — E119 Type 2 diabetes mellitus without complications: Secondary | ICD-10-CM | POA: Diagnosis not present

## 2023-03-11 DIAGNOSIS — N200 Calculus of kidney: Secondary | ICD-10-CM | POA: Diagnosis not present

## 2023-03-11 DIAGNOSIS — D72829 Elevated white blood cell count, unspecified: Secondary | ICD-10-CM | POA: Diagnosis not present

## 2023-03-11 DIAGNOSIS — R109 Unspecified abdominal pain: Secondary | ICD-10-CM | POA: Diagnosis present

## 2023-03-11 LAB — COMPREHENSIVE METABOLIC PANEL
ALT: 34 U/L (ref 0–44)
AST: 31 U/L (ref 15–41)
Albumin: 4.5 g/dL (ref 3.5–5.0)
Alkaline Phosphatase: 53 U/L (ref 38–126)
Anion gap: 15 (ref 5–15)
BUN: 18 mg/dL (ref 8–23)
CO2: 19 mmol/L — ABNORMAL LOW (ref 22–32)
Calcium: 9.6 mg/dL (ref 8.9–10.3)
Chloride: 100 mmol/L (ref 98–111)
Creatinine, Ser: 1.53 mg/dL — ABNORMAL HIGH (ref 0.61–1.24)
GFR, Estimated: 48 mL/min — ABNORMAL LOW (ref >60)
Glucose, Bld: 186 mg/dL — ABNORMAL HIGH (ref 70–99)
Potassium: 4.5 mmol/L (ref 3.5–5.1)
Sodium: 134 mmol/L — ABNORMAL LOW (ref 135–145)
Total Bilirubin: 1.1 mg/dL (ref 0.3–1.2)
Total Protein: 7.5 g/dL (ref 6.5–8.1)

## 2023-03-11 LAB — CBC
HCT: 43.4 % (ref 39.0–52.0)
Hemoglobin: 14.9 g/dL (ref 13.0–17.0)
MCH: 31.2 pg (ref 26.0–34.0)
MCHC: 34.3 g/dL (ref 30.0–36.0)
MCV: 90.8 fL (ref 80.0–100.0)
Platelets: 183 10*3/uL (ref 150–400)
RBC: 4.78 MIL/uL (ref 4.22–5.81)
RDW: 12.1 % (ref 11.5–15.5)
WBC: 13.6 10*3/uL — ABNORMAL HIGH (ref 4.0–10.5)
nRBC: 0 % (ref 0.0–0.2)

## 2023-03-11 LAB — LIPASE, BLOOD: Lipase: 27 U/L (ref 11–51)

## 2023-03-11 MED ORDER — ONDANSETRON HCL 4 MG/2ML IJ SOLN
4.0000 mg | Freq: Once | INTRAMUSCULAR | Status: AC
  Administered 2023-03-11: 4 mg via INTRAVENOUS
  Filled 2023-03-11: qty 2

## 2023-03-11 MED ORDER — TAMSULOSIN HCL 0.4 MG PO CAPS
0.4000 mg | ORAL_CAPSULE | ORAL | Status: AC
  Administered 2023-03-11: 0.4 mg via ORAL
  Filled 2023-03-11: qty 1

## 2023-03-11 MED ORDER — MAGNESIUM SULFATE 2 GM/50ML IV SOLN
2.0000 g | Freq: Once | INTRAVENOUS | Status: AC
  Administered 2023-03-11: 2 g via INTRAVENOUS
  Filled 2023-03-11: qty 50

## 2023-03-11 MED ORDER — MORPHINE SULFATE (PF) 4 MG/ML IV SOLN
4.0000 mg | Freq: Once | INTRAVENOUS | Status: AC
  Administered 2023-03-11: 4 mg via INTRAVENOUS
  Filled 2023-03-11: qty 1

## 2023-03-11 NOTE — ED Triage Notes (Signed)
Pt arrives c/o R sided flank pain since 0300 today. Dx with 8 mm stone in R ureter on 5/10. Spoke with urology on Friday regarding lithotripsy and supposed to have lithotripsy sometime this week, but is not on the schedule yet. Denies dysuria or red blood in urine that he can see. Microscopic blood noted in UA last week. Also states that he had some n/v earlier today but denies at this time.  Took tylenol today, last dose around 1600.

## 2023-03-12 ENCOUNTER — Encounter (HOSPITAL_COMMUNITY): Payer: Self-pay

## 2023-03-12 ENCOUNTER — Encounter (HOSPITAL_COMMUNITY)
Admission: RE | Admit: 2023-03-12 | Discharge: 2023-03-12 | Disposition: A | Discharge status: Home / Self Care | Payer: Medicare Other | Source: Ambulatory Visit | Attending: Urology | Admitting: Urology

## 2023-03-12 ENCOUNTER — Other Ambulatory Visit: Payer: Self-pay

## 2023-03-12 ENCOUNTER — Telehealth: Payer: Self-pay

## 2023-03-12 DIAGNOSIS — N200 Calculus of kidney: Secondary | ICD-10-CM

## 2023-03-12 MED ORDER — TAMSULOSIN HCL 0.4 MG PO CAPS: 0.4000 mg | ORAL_CAPSULE | Freq: Every day | ORAL | 0 refills | Status: DC

## 2023-03-12 MED ORDER — OXYCODONE HCL 5 MG PO TABS: 2.5000 mg | ORAL_TABLET | Freq: Four times a day (QID) | ORAL | 0 refills | Status: DC | PRN

## 2023-03-12 MED ORDER — ONDANSETRON 4 MG PO TBDP: 4.0000 mg | ORAL_TABLET | Freq: Three times a day (TID) | ORAL | 0 refills | Status: DC | PRN

## 2023-03-12 MED ORDER — KETOROLAC TROMETHAMINE 15 MG/ML IJ SOLN
15.0000 mg | Freq: Once | INTRAMUSCULAR | Status: AC
  Administered 2023-03-12: 15 mg via INTRAVENOUS
  Filled 2023-03-12: qty 1

## 2023-03-12 MED ORDER — HYDROMORPHONE HCL 1 MG/ML IJ SOLN
0.5000 mg | Freq: Once | INTRAMUSCULAR | Status: AC
  Administered 2023-03-12: 0.5 mg via INTRAVENOUS
  Filled 2023-03-12: qty 1

## 2023-03-12 NOTE — ED Provider Notes (Signed)
[place] EMERGENCY DEPARTMENT AT [facility] Provider Note   CSN: [id number] Arrival date & time: 03/11/23  2217     History  Chief Complaint  Patient presents with   Flank Pain    [name] is a 72 y.o. male.   Flank Pain  Patient is a 72 year old male with past medical history significant for arthritis, reflux, SVT, DM2, kidney stones first kidney stone diagnosed in August of this past year  He presents emergency room today with complaints of right-sided flank pain that began at 3 AM this morning has been consistent throughout the day.  He states over the past 2 weeks he has had episodes of pain that initially prompted him to go to a urologist where he had a CT scan that showed a 7.5 mm stone in his right ureter.  His pain became out of control today.  He has only been taking Tylenol and was not prescribed any other medications for his pain.  He takes Celebrex daily.  According to [facility] urology recommended lithotripsy but has not been yet scheduled.  He had some nausea and emesis earlier.  No nausea currently and denies any urinary frequency or fever.     Home Medications Prior to Admission medications   Medication Sig Start Date End Date Taking? Authorizing Provider  albuterol (PROVENTIL) (2.5 MG/3ML) 0.083% nebulizer solution Take 2.5 mg by nebulization every 6 (six) hours as needed for wheezing or shortness of breath. 08/08/19  Yes [provider]  albuterol (VENTOLIN HFA) 108 (90 Base) MCG/ACT inhaler Inhale 2 puffs into the lungs every 6 (six) hours as needed for wheezing or shortness of breath. 02/07/18  Yes [provider]  celecoxib (CELEBREX) 200 MG capsule Take 200 mg by mouth daily as needed for moderate pain. 02/07/18  Yes [provider]  Multiple Vitamin (MULTIVITAMIN WITH MINERALS) TABS tablet Take 1 tablet by mouth daily.   Yes [provider]  ondansetron (ZOFRAN-ODT) 4 MG disintegrating tablet Take 1  tablet (4 mg total) by mouth every 8 (eight) hours as needed for up to 3 days for nausea or vomiting. 03/12/23 03/15/23 Yes Cardama, Amadeo Garnet, MD  oxyCODONE (ROXICODONE) 5 MG immediate release tablet Take 0.5-1 tablets (2.5-5 mg total) by mouth every 6 (six) hours as needed for up to 5 days for severe pain. 03/12/23 03/17/23 Yes Cardama, Amadeo Garnet, MD  tamsulosin (FLOMAX) 0.4 MG CAPS capsule Take 1 capsule (0.4 mg total) by mouth daily after supper. 03/12/23  Yes Solon Augusta S, PA      Allergies    Barium sulfate, Barium-containing compounds, and Barium iodide    Review of Systems   Review of Systems  Genitourinary:  Positive for flank pain.    Physical Exam Updated Vital Signs BP 118/70   Pulse (!) 58   Temp 98.2 F (36.8 C) (Oral)   Resp 18   Ht 6' (1.829 m)   Wt 93 kg   SpO2 98%   BMI 27.80 kg/m  Physical Exam Vitals and nursing note reviewed.  Constitutional:      General: He is not in acute distress. HENT:     Head: Normocephalic and atraumatic.     Nose: Nose normal.     Mouth/Throat:     Mouth: Mucous membranes are moist.  Eyes:     General: No scleral icterus. Cardiovascular:     Rate and Rhythm: Normal rate and regular rhythm.     Pulses: Normal pulses.  Heart sounds: Normal heart sounds.  Pulmonary:     Effort: Pulmonary effort is normal. No respiratory distress.     Breath sounds: No wheezing.  Abdominal:     Palpations: Abdomen is soft.     Tenderness: There is abdominal tenderness. There is right CVA tenderness. There is no guarding or rebound.     Comments: Mild suprapubic and right lower quadrant tenderness no guarding or rebound  Musculoskeletal:     Cervical back: Normal range of motion.     Right lower leg: No edema.     Left lower leg: No edema.  Skin:    General: Skin is warm and dry.     Capillary Refill: Capillary refill takes less than 2 seconds.  Neurological:     Mental Status: He is alert. Mental status is at baseline.   Psychiatric:        Mood and Affect: Mood normal.        Behavior: Behavior normal.     ED Results / Procedures / Treatments   Labs (all labs ordered are listed, but only abnormal results are displayed) Labs Reviewed  COMPREHENSIVE METABOLIC PANEL - Abnormal; Notable for the following components:      Result Value   Sodium 134 (*)    CO2 19 (*)    Glucose, Bld 186 (*)    Creatinine, Ser 1.53 (*)    GFR, Estimated 48 (*)    All other components within normal limits  CBC - Abnormal; Notable for the following components:   WBC 13.6 (*)    All other components within normal limits  LIPASE, BLOOD  URINALYSIS, ROUTINE W REFLEX MICROSCOPIC    EKG None  Radiology No results found.  Procedures Procedures    Medications Ordered in ED Medications  morphine (PF) 4 MG/ML injection 4 mg (4 mg Intravenous Given 03/11/23 2309)  ondansetron (ZOFRAN) injection 4 mg (4 mg Intravenous Given 03/11/23 2308)  magnesium sulfate IVPB 2 g 50 mL (0 g Intravenous Stopped 03/12/23 0025)  tamsulosin (FLOMAX) capsule 0.4 mg (0.4 mg Oral Given 03/11/23 2311)  ketorolac (TORADOL) 15 MG/ML injection 15 mg (15 mg Intravenous Given 03/12/23 0016)  HYDROmorphone (DILAUDID) injection 0.5 mg (0.5 mg Intravenous Given 03/12/23 0018)    ED Course/ Medical Decision Making/ A&P Clinical Course as of 03/12/23 0316  Sun Mar 11, 2023  2249 2 weeks of pain today got much worse Was seen at urology, only taking tylenol [WF]    Clinical Course User Index [WF] Gailen Shelter, PA                             Medical Decision Making Amount and/or Complexity of Data Reviewed Labs: ordered.  Risk Prescription drug management.   This patient presents to the ED for concern of R flank pain, this involves a number of treatment options, and is a complaint that carries with it a high risk of complications and morbidity. A differential diagnosis was considered for the patient's symptoms which is discussed below:    The differential diagnosis of emergent flank pain includes, but is not limited to :Abdominal aortic aneurysm,, Renal artery embolism,Renal vein thrombosis, Aortic dissection, Mesenteric ischemia, Pyelonephritis, Renal infarction, Renal hemorrhage, Nephrolithiasis/ Renal Colic, Bladder tumor,Cystitis, Biliary colic, Pancreatitis Perforated peptic ulcer Appendicitis ,Inguinal Hernia, Diverticulitis, Bowel obstruction Testicular torsion,Epididymitis Shingles Lower lobe pneumonia, Retroperitoneal hematoma/abscess/tumor, Epidural abscess, Epidural hematoma    Co morbidities: Discussed in HPI   Brief  History:  Patient is a 72 year old male with past medical history significant for arthritis, reflux, SVT, DM2, kidney stones first kidney stone diagnosed in August of this past year  He presents emergency room today with complaints of right-sided flank pain that began at 3 AM this morning has been consistent throughout the day.  He states over the past 2 weeks he has had episodes of pain that initially prompted him to go to a urologist where he had a CT scan that showed a 7.5 mm stone in his right ureter.  His pain became out of control today.  He has only been taking Tylenol and was not prescribed any other medications for his pain.  He takes Celebrex daily.  According to [facility] urology recommended lithotripsy but has not been yet scheduled.  He had some nausea and emesis earlier.  No nausea currently and denies any urinary frequency or fever.    EMR reviewed including pt PMHx, past surgical history and past visits to ER.   See HPI for more details   Lab Tests:   I ordered and independently interpreted labs. Labs notable for CMP without acute abnormal finding.  Lipase within normal limits CBC with mild nonspecific leukocytosis likely vomiting.  Imaging Studies:  Abnormal findings. I personally reviewed all imaging studies. Imaging notable for  No CT scans or x-rays were done  today however I reviewed patient's CT scan times 03/09/2023 which showed right-sided ureteral stone that is 8 mm  Cardiac Monitoring:  The patient was maintained on a cardiac monitor.  I personally viewed and interpreted the cardiac monitored which showed an underlying rhythm of: NSR   Medicines ordered:  I ordered medication including magnesium, Dilaudid, Toradol, tamsulosin, morphine, Zofran for pain and nausea Reevaluation of the patient after these medicines showed that the patient resolved I have reviewed the patients home medicines and have made adjustments as needed   Critical Interventions:     Consults/Attending Physician   I discussed this case with my attending physician who cosigned this note including patient's presenting symptoms, physical exam, and planned diagnostics and interventions. Attending physician stated agreement with plan or made changes to plan which were implemented.   Attending physician assessed patient at bedside.    Reevaluation:  After the interventions noted above I re-evaluated patient and found that they have :resolved   Social Determinants of Health:      Problem List / ED Course:  Patient with 7.5-8 mm right-sided ureteral stone distal ureter with hydronephrosis.  He has been in touch with urology and they are planning on doing lithotripsy this week pain was controlled here in the emergency department he is feeling much improved.  No fevers and low suspicion for UTI he does not have a history of UTI.  Show decision conversation made with patient and he is comfortable holding off on urinalysis as he is requesting discharge home at this time as he is pain controlled.  He will call urology in the morning to see if he can be seen in the office expeditiously.  Will prescribe Percocet, Zofran, tamsulosin.   Dispostion:  After consideration of the diagnostic results and the patients response to treatment, I feel that the patent would benefit  from outpatient follow-up with return precautions to the emergency department   Final Clinical Impression(s) / ED Diagnoses Final diagnoses:  Kidney stone    Rx / DC Orders ED Discharge Orders          Ordered    oxyCODONE (ROXICODONE)  5 MG immediate release tablet  Every 6 hours PRN        03/12/23 0206    ondansetron (ZOFRAN-ODT) 4 MG disintegrating tablet  Every 8 hours PRN        03/12/23 0206    tamsulosin (FLOMAX) 0.4 MG CAPS capsule  Daily after supper        03/12/23 0213              Gailen Shelter, PA 03/12/23 0322    Nira Conn, MD 03/13/23 1142

## 2023-03-12 NOTE — Discharge Instructions (Addendum)
For pain control you may take 1000 mg of Tylenol every 8 hours as needed.  In addition you can take 0.5 to 1 tablet of Oxycodone every 6 hours as needed for pain not controlled with the Tylenol regimen.   I have prescribed or offered you other medicine for breakthrough pain. Notably there is a small amount of Tylenol--specifically 325 mg--in each dose of Percocet.  If you do take a dose of Percocet please take a 500 mg instead of the 1000 mg dose of Tylenol.     Follow up with [facility] I have given you the information for their office.  Generally kidney stones pass on their own given time in the focus of treatment is minimizing pain

## 2023-03-12 NOTE — Telephone Encounter (Signed)
Patient aware of MD response.   I spoke with [name]. We have discussed possible surgery dates and 03/13/2023 was agreed upon by all parties. Patient given information about surgery date, what to expect pre-operatively and post operatively.    We discussed that a pre-op nurse will be calling to set up the pre-op visit that will take place prior to surgery. Informed patient that our office will communicate any additional care to be provided after surgery.    Patients questions or concerns were discussed during our call. Advised to call our office should there be any additional information, questions or concerns that arise. Patient verbalized understanding.

## 2023-03-12 NOTE — Telephone Encounter (Signed)
-----   Message from Bjorn Pippin, MD sent at 03/09/2023 12:29 PM EDT ----- He has an 8mm right mid ureteral stone and would like to set up ESWL.  Can you let me know what is available in [place] with Dr. Retta Diones or Dr. Ronne Binning in the next couple of Tuesdays and I can get you a posting sheet.

## 2023-03-13 ENCOUNTER — Encounter (HOSPITAL_COMMUNITY)
Admission: RE | Disposition: A | Discharge status: Home / Self Care | Payer: Self-pay | Source: Home / Self Care | Attending: Urology

## 2023-03-13 ENCOUNTER — Ambulatory Visit (HOSPITAL_COMMUNITY)
Admission: RE | Admit: 2023-03-13 | Discharge: 2023-03-13 | Disposition: A | Discharge status: Home / Self Care | Payer: Medicare Other | Attending: Urology | Admitting: Urology

## 2023-03-13 ENCOUNTER — Ambulatory Visit (HOSPITAL_COMMUNITY): Payer: Medicare Other

## 2023-03-13 ENCOUNTER — Other Ambulatory Visit: Payer: Self-pay

## 2023-03-13 ENCOUNTER — Encounter (HOSPITAL_COMMUNITY): Payer: Self-pay | Admitting: Urology

## 2023-03-13 DIAGNOSIS — N201 Calculus of ureter: Secondary | ICD-10-CM | POA: Diagnosis not present

## 2023-03-13 DIAGNOSIS — N200 Calculus of kidney: Secondary | ICD-10-CM

## 2023-03-13 HISTORY — PX: EXTRACORPOREAL SHOCK WAVE LITHOTRIPSY: SHX1557

## 2023-03-13 LAB — GLUCOSE, CAPILLARY: Glucose-Capillary: 113 mg/dL — ABNORMAL HIGH (ref 70–99)

## 2023-03-13 SURGERY — LITHOTRIPSY, ESWL
Anesthesia: LOCAL | Laterality: Right

## 2023-03-13 MED ORDER — OXYCODONE HCL 5 MG PO TABS: 2.5000 mg | ORAL_TABLET | Freq: Four times a day (QID) | ORAL | 0 refills | Status: AC | PRN

## 2023-03-13 MED ORDER — TAMSULOSIN HCL 0.4 MG PO CAPS: 0.4000 mg | ORAL_CAPSULE | Freq: Every day | ORAL | 0 refills | Status: AC

## 2023-03-13 MED ORDER — DIPHENHYDRAMINE HCL 25 MG PO CAPS
25.0000 mg | ORAL_CAPSULE | ORAL | Status: AC
  Administered 2023-03-13: 25 mg via ORAL
  Filled 2023-03-13: qty 1

## 2023-03-13 MED ORDER — DIAZEPAM 5 MG PO TABS
10.0000 mg | ORAL_TABLET | Freq: Once | ORAL | Status: AC
  Administered 2023-03-13: 10 mg via ORAL
  Filled 2023-03-13: qty 2

## 2023-03-13 MED ORDER — ONDANSETRON 4 MG PO TBDP: 4.0000 mg | ORAL_TABLET | Freq: Three times a day (TID) | ORAL | 0 refills | Status: AC | PRN

## 2023-03-13 MED ORDER — SODIUM CHLORIDE 0.9 % IV SOLN: INTRAVENOUS | Status: DC

## 2023-03-13 NOTE — Interval H&P Note (Signed)
History and Physical Interval Note:  03/13/2023 9:08 AM  [name]  has presented today for surgery, with the diagnosis of Right ureteral calculus.  The various methods of treatment have been discussed with the patient and family. After consideration of risks, benefits and other options for treatment, the patient has consented to  Procedure(s): EXTRACORPOREAL SHOCK WAVE LITHOTRIPSY (ESWL) (Right) as a surgical intervention.  The patient's history has been reviewed, patient examined, no change in status, stable for surgery.  I have reviewed the patient's chart and labs.  Questions were answered to the patient's satisfaction.     Wilkie Aye

## 2023-03-14 ENCOUNTER — Ambulatory Visit: Payer: Medicare Other | Admitting: Urology

## 2023-03-15 ENCOUNTER — Encounter (HOSPITAL_COMMUNITY): Payer: Self-pay | Admitting: Urology

## 2023-04-02 DIAGNOSIS — N200 Calculus of kidney: Secondary | ICD-10-CM | POA: Insufficient documentation

## 2023-04-02 NOTE — Progress Notes (Unsigned)
Diagnoses: 1) Post-operative state  HPI: [name] presents post-operatively s/p right ESWL procedure on 03/14/2023 by Dr. Ronne Binning.  Postop course: KUB today: Awaiting radiology read; no right ureteral stones appreciated per my interpretation.  He reports increased urinary urgency, frequency, dysuria, intermittent stream, hesitancy. Denies gross hematuria, straining to void, or sensations of incomplete emptying. Denies fevers, flank pain, or abdominal pain.   Fall Screening: Do you usually have a device to assist in your mobility? No    Medications: Current Outpatient Medications  Medication Sig Dispense Refill   acetaminophen (TYLENOL) 325 MG tablet Take 650 mg by mouth every 6 (six) hours as needed.     albuterol (PROVENTIL) (2.5 MG/3ML) 0.083% nebulizer solution Take 2.5 mg by nebulization every 6 (six) hours as needed for wheezing or shortness of breath.     albuterol (VENTOLIN HFA) 108 (90 Base) MCG/ACT inhaler Inhale 2 puffs into the lungs every 6 (six) hours as needed for wheezing or shortness of breath.     Blood Glucose Monitoring Suppl (ONETOUCH VERIO FLEX SYSTEM) w/Device KIT See admin instructions.     celecoxib (CELEBREX) 200 MG capsule Take 200 mg by mouth daily as needed for moderate pain.     glucose blood (ONETOUCH VERIO) test strip For use when checking blood sugars finger stick once daily, alternating mornings and evenings before meals     Multiple Vitamin (MULTIVITAMIN WITH MINERALS) TABS tablet Take 1 tablet by mouth daily.     omeprazole (PRILOSEC OTC) 20 MG tablet Take 20 mg by mouth daily.     ondansetron (ZOFRAN-ODT) 4 MG disintegrating tablet Take 4 mg by mouth every 8 (eight) hours as needed.     tamsulosin (FLOMAX) 0.4 MG CAPS capsule Take 1 capsule (0.4 mg total) by mouth daily after supper. 30 capsule 0   No current facility-administered medications for this visit.    Allergies: Allergies  Allergen Reactions   Barium Sulfate Other (See  Comments)    Other reaction(s): hot flashes and vomiting    Barium-Containing Compounds    Barium Iodide Nausea And Vomiting    Past Medical History:  Diagnosis Date   Allergic rhinitis    Arthritis    Degenerative cervical disc    Diabetes mellitus without complication (HCC)    Dysrhythmia    hx tachycardia-ablation 2010   Esophageal stricture    GERD (gastroesophageal reflux disease)    hx-no meds now   PSVT (paroxysmal supraventricular tachycardia)    Tinnitus    Wears glasses    Past Surgical History:  Procedure Laterality Date   APPENDECTOMY     CARDIAC CATHETERIZATION  2010   cardiac ablation   CARPAL TUNNEL RELEASE Left 07/10/2013   Procedure: CARPAL TUNNEL RELEASE;  Surgeon: Wyn Forster., MD;  Location: [facility];  Service: Orthopedics;  Laterality: Left;   EXTRACORPOREAL SHOCK WAVE LITHOTRIPSY Right 03/13/2023   Procedure: EXTRACORPOREAL SHOCK WAVE LITHOTRIPSY (ESWL);  Surgeon: Malen Gauze, MD;  Location: [facility];  Service: Urology;  Laterality: Right;   INGUINAL HERNIA REPAIR  1956   left age 74   INGUINAL HERNIA REPAIR     right   ORIF WRIST FRACTURE  1975   bone graft-right   THYROGLOSSAL DUCT CYST  1963   TONSILLECTOMY     History reviewed. No pertinent family history. Social History   Socioeconomic History   Marital status: Married    Spouse name: Not on file   Number of children: Not on file  Years of education: Not on file   Highest education level: Not on file  Occupational History   Not on file  Tobacco Use   Smoking status: Former    Packs/day: 1.00    Years: 10.00    Additional pack years: 0.00    Total pack years: 10.00    Types: Cigarettes    Quit date: 07/10/1979    Years since quitting: 43.7   Smokeless tobacco: Not on file  Vaping Use   Vaping Use: Never used  Substance and Sexual Activity   Alcohol use: Yes    Alcohol/week: 35.0 standard drinks of alcohol    Types: 21 Cans of beer, 14 Shots of liquor  per week    Comment: daily beer or alcohol   Drug use: No   Sexual activity: Yes  Other Topics Concern   Not on file  Social History Narrative   Not on file   Social Determinants of Health   Financial Resource Strain: Not on file  Food Insecurity: Not on file  Transportation Needs: Not on file  Physical Activity: Not on file  Stress: Not on file  Social Connections: Not on file  Intimate Partner Violence: Not on file     SUBJECTIVE  Review of Systems Constitutional: Patient denies any unintentional weight loss or change in strength lntegumentary: Patient denies any rashes or pruritus Cardiovascular: Patient denies chest pain or syncope Respiratory: Patient denies shortness of breath Gastrointestinal: Patient denies nausea, vomiting, constipation, or diarrhea Musculoskeletal: Patient denies muscle cramps or weakness Neurologic: Patient denies convulsions or seizures Allergic/Immunologic: Patient denies recent allergic reaction(s) Hematologic/Lymphatic: Patient denies bleeding tendencies Endocrine: Patient denies heat/cold intolerance  GU: As per HPI.  OBJECTIVE Vitals:   04/03/23 1138  BP: (!) 141/77  Pulse: (!) 50  Temp: 98.4 F (36.9 C)   There is no height or weight on file to calculate BMI.  Physical Examination  Constitutional: No obvious distress; patient is non-toxic appearing  Cardiovascular: No visible lower extremity edema.  Respiratory: The patient does not have audible wheezing/stridor; respirations do not appear labored  Gastrointestinal: Abdomen non-distended Musculoskeletal: Normal ROM of UEs  Skin: No obvious rashes/open sores  Neurologic: CN 2-12 grossly intact Psychiatric: Answered questions appropriately with normal affect  Hematologic/Lymphatic/Immunologic: No obvious bruises or sites of spontaneous bleeding  UA: negative   ASSESSMENT Kidney stones - Plan: Urinalysis, Routine w reflex microscopic, Calculi, with Photograph, Abdomen 1  view (KUB)  Postop check  We reviewed the operative procedures and findings. Pre-operative symptoms are improved since the procedure.   We reviewed recent imaging results; no acute findings. Will send stone fragments for analysis.   Advised adequate hydration and we discussed option to consider low oxalate diet given that calcium oxalate is the most common type of stone. Handout provided about stone prevention diet.  Will plan for follow up in 6 months with KUB for stone surveillance or sooner if needed. Pt verbalized understanding and agreement. All questions were answered.  PLAN Advised the following: Stone analysis. Return in about 6 months (around 10/03/2023) for KUB, UA, & f/u with Evette Georges NP.  Orders Placed This Encounter  Procedures   Abdomen 1 view (KUB)    Standing Status:   Future    Standing Expiration Date:   04/02/2024    Order Specific Question:   Reason for Exam (SYMPTOM  OR DIAGNOSIS REQUIRED)    Answer:   kidney stones    Order Specific Question:   Preferred imaging location?  Answer:   [facility]   Urinalysis, Routine w reflex microscopic   Calculi, with Photograph    It has been explained that the patient is to follow regularly with their PCP in addition to all other providers involved in their care and to follow instructions provided by these respective offices. Patient advised to contact urology clinic if any urologic-pertaining questions, concerns, new symptoms or problems arise in the interim period.  Patient Instructions  >80% of stones are calcium oxalate. This type of stones forms when body either isn't clearing oxalate well enough, is making too much oxalate, or too little citrate. This results in oxalate binding to form crystals, which continue to aggregate and form stones.  Limiting calcium does not help, but limiting oxalate in the diet can help. Increasing citric acid intake may also help.  The following measures may help to prevent  the recurrence of stones: Increase water intake to 2-2.5 liters per day May add citrus juice (lemon, lime or orange juice) to water Moderation in dairy foods Decrease in salt content Low Oxalate diet: Oxylates are found in foods like Tomato, Spinach, red wine and chocolate (see additional resources below).  Internet resources for information regarding low oxalate diet:  https://kidneystones.yangchunwu.com https://my.VerticalStretch.be  Foods Low in Sodium or Oxalate Foods You Can Eat  Drinks Coffee, fruit and veggie juice (using the recommended veggies), fruit punch  Fruits Apples, apricots (fresh or canned), avocado, bananas, cherries (sweet), cranberries, grapefruit, red or green grapes, lemon and lime juice, melons, nectarines, papayas, peaches, pears, pineapples, oranges, strawberries (fresh), tangerines  Veggies Artichokes, asparagus, bamboo shoots, broccoli, brussels sprouts, cabbage, cauliflower, chayote squash, chicory, corn, cucumbers, endive, lettuce, lima beans, mushrooms, onions, peas, peppers, potatoes, radishes, rutabagas, zucchini  Breads, Cereals, Grains Egg noodles, rye bread, cooked and dry cereals without nuts or bran, crackers with unsalted tops, white or wild rice  Meat, Meat Replacements, Fish, Recruitment consultant, fish, poultry, eggs, egg whites, egg replacements  Soup Homemade soup (using the recommended veggies and meat), low-sodium bouillon, low-sodium canned  Desserts Cookies, cakes, ice cream, pudding without chocolate or nuts, candy without chocolate or nuts  Fats and Oils Butter, margarine, cream, oil, salad dressing, mayo  Other Foods Unsalted potato chips or pretzels, herbs (like garlic, garlic powder, onion powder), lemon juice, salt-free seasoning blends, vinegar  Other Foods Low in Oxalate Foods You Can Eat  Drinks Beer, cola, wine, buttermilk, lemonade or limeade (without  added vitamin C), milk  Meat, Meat Replacements, Fish, [employer] meat, ham, bacon, hot dogs, bratwurst, sausage, chicken nuggets, cheddar cheese, canned fish and shellfish  Soup Tomato soup, cheese soup  Other Foods Coconuts, lemon or lime juices, sugar or sweeteners, jellies or jams (from the recommended list)   Moderate-Oxalate Foods Foods to Limit   Drinks Fruit and veggie juices (from the list below), chocolate milk, rice milk, hot cocoa, tea   Fruits Blackberries, blueberries, black currants, cherries (sour), fruit cocktail, mangoes, orange peel, prunes, purple plums   Veggies Baked beans, carrots, celery, green beans, parsnips, summer squash, tomatoes, turnips   Breads, Cereals, Grains White bread, cornbread or cornmeal, white English muffins, saltine or soda crackers, brown rice, vanilla wafers, spaghetti and other noodles, firm tofu, bagels, oatmeal   Meat/meat replacements, fish, poultry Sardines   Desserts Chocolate cake   Fats and Oils Macadamia nuts, pistachio nuts, English walnuts   Other Foods Jams or jellies (made with the fruits above), pepper   High-Oxalate Foods Foods to Avoid Drinks Chocolate drink mixes, soy  milk, Ovaltine, instant iced tea, fruit juices of fruits listed below Fruits Apricots (dried), red currants, figs, kiwi, plums, rhubarb Veggies Beans (wax, dried), beets and beet greens, chives, collard greens, eggplant, escarole, dark greens of all kinds, leeks, okra, parsley, rutabagas, spinach, Swiss chard, tomato paste, watercress Breads, Cereals, Grains Amaranth, barley, white corn flour, fried potatoes, fruitcake, grits, soybean products, sweet potatoes, wheat germ and bran, buckwheat flour, All Bran cereal, graham crackers, pretzels, whole wheat bread Meat/meat replacements, fish, poultry Dried beans, peanut butter, soy burgers, miso Desserts Carob, chocolate, marmalades Fats and Oils Nuts (peanuts, almonds, pecans, cashews, hazelnuts), nut butters, sesame  seeds, tahini paste Other Foods Poppy seeds   Electronically signed by:  Donnita Falls, MSN, FNP-C, CUNP 04/03/2023 12:56 PM

## 2023-04-03 ENCOUNTER — Ambulatory Visit (INDEPENDENT_AMBULATORY_CARE_PROVIDER_SITE_OTHER): Payer: Medicare Other | Admitting: Urology

## 2023-04-03 ENCOUNTER — Ambulatory Visit (HOSPITAL_COMMUNITY)
Admission: RE | Admit: 2023-04-03 | Discharge: 2023-04-03 | Disposition: A | Discharge status: Home / Self Care | Payer: Medicare Other | Source: Ambulatory Visit | Attending: Urology | Admitting: Urology

## 2023-04-03 ENCOUNTER — Encounter: Payer: Self-pay | Admitting: Urology

## 2023-04-03 VITALS — BP 141/77 | HR 50 | Temp 98.4°F

## 2023-04-03 DIAGNOSIS — N200 Calculus of kidney: Secondary | ICD-10-CM | POA: Diagnosis not present

## 2023-04-03 DIAGNOSIS — Z0389 Encounter for observation for other suspected diseases and conditions ruled out: Secondary | ICD-10-CM | POA: Diagnosis not present

## 2023-04-03 DIAGNOSIS — Z09 Encounter for follow-up examination after completed treatment for conditions other than malignant neoplasm: Secondary | ICD-10-CM

## 2023-04-03 LAB — URINALYSIS, ROUTINE W REFLEX MICROSCOPIC
Bilirubin, UA: NEGATIVE
Glucose, UA: NEGATIVE
Ketones, UA: NEGATIVE
Leukocytes,UA: NEGATIVE
Nitrite, UA: NEGATIVE
Protein,UA: NEGATIVE
RBC, UA: NEGATIVE
Specific Gravity, UA: 1.02 (ref 1.005–1.030)
Urobilinogen, Ur: 0.2 mg/dL (ref 0.2–1.0)
pH, UA: 6 (ref 5.0–7.5)

## 2023-04-03 NOTE — Patient Instructions (Signed)

## 2023-04-11 ENCOUNTER — Telehealth: Payer: Self-pay

## 2023-04-11 ENCOUNTER — Ambulatory Visit: Payer: Medicare Other | Admitting: Urology

## 2023-04-11 LAB — CALCULI, WITH PHOTOGRAPH (CLINICAL LAB)
Calcium Oxalate Dihydrate: 40 %
Calcium Oxalate Monohydrate: 60 %
Weight Calculi: 113 mg

## 2023-04-11 NOTE — Telephone Encounter (Signed)
Tried to call patient and left voiced message for return call to office.

## 2023-04-11 NOTE — Telephone Encounter (Signed)
Open in error

## 2023-04-11 NOTE — Telephone Encounter (Signed)
-----   Message from Donnita Falls, FNP sent at 04/11/2023  8:22 AM EDT ----- Please let pt know stone analysis showed 100% calcium oxalate composition. Please advise adequate fluid intake (around 2 liters per day) and low oxalate diet for stone prevention.

## 2023-04-11 NOTE — Progress Notes (Signed)
Please let pt know stone analysis showed 100% calcium oxalate composition. Please advise adequate fluid intake (around 2 liters per day) and low oxalate diet for stone prevention.

## 2023-04-12 NOTE — Telephone Encounter (Signed)
Tried calling patient with no answer, left voiced message for return call. Mailed out low oxalate diet pamphlet.

## 2023-04-12 NOTE — Telephone Encounter (Signed)
Patient's wife Raynelle Fanning returned call.  Please give her a call back at [phone].  Thank you.

## 2023-04-13 ENCOUNTER — Telehealth: Payer: Self-pay

## 2023-04-13 NOTE — Telephone Encounter (Signed)
Patient's wife returned your call and requested a call back. I made her aware that you mailed a diet form and they should receive it within 1 week.

## 2023-04-13 NOTE — Telephone Encounter (Signed)
Return call back to patient. Patient is made aware of Maralyn Sago, FNP recommendation. Patient states he passed a large stone three days after his x-ray that he states was not seen on the x-ray. Made patient aware that I will document and pass this information along. Patient voiced understanding

## 2023-04-23 DIAGNOSIS — L821 Other seborrheic keratosis: Secondary | ICD-10-CM | POA: Diagnosis not present

## 2023-04-23 DIAGNOSIS — L905 Scar conditions and fibrosis of skin: Secondary | ICD-10-CM | POA: Diagnosis not present

## 2023-04-23 DIAGNOSIS — X32XXXD Exposure to sunlight, subsequent encounter: Secondary | ICD-10-CM | POA: Diagnosis not present

## 2023-04-23 DIAGNOSIS — D225 Melanocytic nevi of trunk: Secondary | ICD-10-CM | POA: Diagnosis not present

## 2023-04-23 DIAGNOSIS — L570 Actinic keratosis: Secondary | ICD-10-CM | POA: Diagnosis not present

## 2023-04-23 DIAGNOSIS — D485 Neoplasm of uncertain behavior of skin: Secondary | ICD-10-CM | POA: Diagnosis not present

## 2023-05-17 DIAGNOSIS — E119 Type 2 diabetes mellitus without complications: Secondary | ICD-10-CM | POA: Diagnosis not present

## 2023-05-23 DIAGNOSIS — L905 Scar conditions and fibrosis of skin: Secondary | ICD-10-CM | POA: Diagnosis not present

## 2023-05-23 DIAGNOSIS — D485 Neoplasm of uncertain behavior of skin: Secondary | ICD-10-CM | POA: Diagnosis not present

## 2023-05-29 DIAGNOSIS — H903 Sensorineural hearing loss, bilateral: Secondary | ICD-10-CM | POA: Diagnosis not present

## 2023-09-03 DIAGNOSIS — E1169 Type 2 diabetes mellitus with other specified complication: Secondary | ICD-10-CM | POA: Diagnosis not present

## 2023-09-12 DIAGNOSIS — L820 Inflamed seborrheic keratosis: Secondary | ICD-10-CM | POA: Diagnosis not present

## 2023-09-12 DIAGNOSIS — L821 Other seborrheic keratosis: Secondary | ICD-10-CM | POA: Diagnosis not present

## 2023-09-12 DIAGNOSIS — D225 Melanocytic nevi of trunk: Secondary | ICD-10-CM | POA: Diagnosis not present

## 2023-10-02 NOTE — Progress Notes (Unsigned)
Name: [name] DOB: 1951-04-24 MRN: [id number]  History of Present Illness: Mr. Fjerstad is a 72 y.o. male who presents today for follow up visit at [facility]. - GU History: 1. Kidney stones. - 03/14/2023: Underwent right ESWL by Dr. Ronne Binning.   At last visit on 04/03/2023: - Negative KUB. - Stone analysis showed 100% calcium oxalate composition.  Today: KUB today: Awaiting radiology read; no stones appreciated per provider interpretation.  He reports that yesterday there were two small "dime size" areas of blood in his underwear when he woke up, which has never happened before. Denies history of gross hematuria, trauma, falls, recent injury, recent illness. Distant smoking history (quit in 1980 after smoking 1 ppd x10 years). Denies known family history of prostate cancer (adopted). Denies history of elevated PSA results; no PSA results found per chart review.  He denies recent stone passage. He denies flank pain or abdominal pain. He denies fevers, nausea, or vomiting.  He denies increased urinary urgency, frequency, nocturia, dysuria, gross hematuria, hesitancy, straining to void, or sensations of incomplete emptying.   Fall Screening: Do you usually have a device to assist in your mobility? No   Medications: Current Outpatient Medications  Medication Sig Dispense Refill   acetaminophen (TYLENOL) 325 MG tablet Take 650 mg by mouth every 6 (six) hours as needed.     albuterol (PROVENTIL) (2.5 MG/3ML) 0.083% nebulizer solution Take 2.5 mg by nebulization every 6 (six) hours as needed for wheezing or shortness of breath.     albuterol (VENTOLIN HFA) 108 (90 Base) MCG/ACT inhaler Inhale 2 puffs into the lungs every 6 (six) hours as needed for wheezing or shortness of breath.     Blood Glucose Monitoring Suppl (ONETOUCH VERIO FLEX SYSTEM) w/Device KIT See admin instructions.     celecoxib (CELEBREX) 200 MG capsule Take 200 mg by mouth daily as needed  for moderate pain.     glucose blood (ONETOUCH VERIO) test strip For use when checking blood sugars finger stick once daily, alternating mornings and evenings before meals     Multiple Vitamin (MULTIVITAMIN WITH MINERALS) TABS tablet Take 1 tablet by mouth daily.     omeprazole (PRILOSEC OTC) 20 MG tablet Take 20 mg by mouth daily.     ondansetron (ZOFRAN-ODT) 4 MG disintegrating tablet Take 4 mg by mouth every 8 (eight) hours as needed.     tamsulosin (FLOMAX) 0.4 MG CAPS capsule Take 1 capsule (0.4 mg total) by mouth daily after supper. 30 capsule 0   No current facility-administered medications for this visit.    Allergies: Allergies  Allergen Reactions   Barium Sulfate Other (See Comments)    Other reaction(s): hot flashes and vomiting    Barium-Containing Compounds    Barium Iodid Nausea And Vomiting    Past Medical History:  Diagnosis Date   Allergic rhinitis    Arthritis    Degenerative cervical disc    Diabetes mellitus without complication (HCC)    Dysrhythmia    hx tachycardia-ablation 2010   Esophageal stricture    GERD (gastroesophageal reflux disease)    hx-no meds now   PSVT (paroxysmal supraventricular tachycardia) (HCC)    Tinnitus    Wears glasses    Past Surgical History:  Procedure Laterality Date   APPENDECTOMY     CARDIAC CATHETERIZATION  2010   cardiac ablation   CARPAL TUNNEL RELEASE Left 07/10/2013   Procedure: CARPAL TUNNEL RELEASE;  Surgeon: Wyn Forster., MD;  Location: [facility];  Service: Orthopedics;  Laterality: Left;   EXTRACORPOREAL SHOCK WAVE LITHOTRIPSY Right 03/13/2023   Procedure: EXTRACORPOREAL SHOCK WAVE LITHOTRIPSY (ESWL);  Surgeon: Malen Gauze, MD;  Location: [facility];  Service: Urology;  Laterality: Right;   INGUINAL HERNIA REPAIR  1956   left age 26   INGUINAL HERNIA REPAIR     right   ORIF WRIST FRACTURE  1975   bone graft-right   THYROGLOSSAL DUCT CYST  1963   TONSILLECTOMY     History reviewed.  No pertinent family history. Social History   Socioeconomic History   Marital status: Married    Spouse name: Not on file   Number of children: Not on file   Years of education: Not on file   Highest education level: Not on file  Occupational History   Not on file  Tobacco Use   Smoking status: Former    Current packs/day: 0.00    Average packs/day: 1 pack/day for 10.0 years (10.0 ttl pk-yrs)    Types: Cigarettes    Start date: 07/09/1969    Quit date: 07/10/1979    Years since quitting: 44.2   Smokeless tobacco: Not on file  Vaping Use   Vaping status: Never Used  Substance and Sexual Activity   Alcohol use: Yes    Alcohol/week: 35.0 standard drinks of alcohol    Types: 21 Cans of beer, 14 Shots of liquor per week    Comment: daily beer or alcohol   Drug use: No   Sexual activity: Yes  Other Topics Concern   Not on file  Social History Narrative   Not on file   Social Determinants of Health   Financial Resource Strain: Not on file  Food Insecurity: Not on file  Transportation Needs: Not on file  Physical Activity: Not on file  Stress: Not on file  Social Connections: Not on file  Intimate Partner Violence: Not on file    SUBJECTIVE  Review of Systems Constitutional: Patient denies any unintentional weight loss or change in strength lntegumentary: Patient denies any rashes or pruritus Cardiovascular: Patient denies chest pain or syncope Respiratory: Patient denies shortness of breath Gastrointestinal: Patient denies nausea, vomiting, constipation, or diarrhea Musculoskeletal: Patient denies muscle cramps or weakness Neurologic: Patient denies convulsions or seizures Allergic/Immunologic: Patient denies recent allergic reaction(s) Hematologic/Lymphatic: Patient denies bleeding tendencies Endocrine: Patient denies heat/cold intolerance  GU: As per HPI.  OBJECTIVE Vitals:   10/03/23 1057  BP: (!) 169/90  Pulse: 76   There is no height or weight on file to  calculate BMI.  Physical Examination Constitutional: No obvious distress; patient is non-toxic appearing  Cardiovascular: No visible lower extremity edema.  Respiratory: The patient does not have audible wheezing/stridor; respirations do not appear labored  Gastrointestinal: Abdomen non-distended Musculoskeletal: Normal ROM of UEs  Skin: No obvious rashes/open sores  Neurologic: CN 2-12 grossly intact Psychiatric: Answered questions appropriately with normal affect  Hematologic/Lymphatic/Immunologic: No obvious bruises or sites of spontaneous bleeding  UA: negative   ASSESSMENT Kidney stones - Plan: Urinalysis, Routine w reflex microscopic, DG Abd 1 View  We discussed possible etiologies for blood spotting in underwear yesterday including but not limited to sexual activity, stone, trauma, blood thinner use, urinary tract infection, kidney function, BPH, malignancy. We discussed pt's smoking as a risk factor for GU cancer and encouraged continued cessation of all nicotine / tobacco products. We reviewed recent imaging results; awaiting radiology results, appears to have no stones at this time per provider interpretation. Suspect  BPH as most likely cause for spotting. We discussed option to proceed with observation versus gross hematuria workup including CT hematuria protocol and cystoscopy. Based on his low risk factors for malignancy he elected to proceed with observation. Will plan to follow up in 1 year with KUB for stone surveillance or sooner if needed. Pt verbalized understanding and agreement. All questions were answered.  PLAN Advised the following: Return in about 1 year (around 10/02/2024) for KUB, UA, & f/u with Evette Georges NP.  Orders Placed This Encounter  Procedures   DG Abd 1 View    Standing Status:   Future    Standing Expiration Date:   10/02/2024    Order Specific Question:   Reason for Exam (SYMPTOM  OR DIAGNOSIS REQUIRED)    Answer:   kidney stone    Order Specific  Question:   Preferred imaging location?    Answer:   [facility]   Urinalysis, Routine w reflex microscopic    It has been explained that the patient is to follow regularly with their PCP in addition to all other providers involved in their care and to follow instructions provided by these respective offices. Patient advised to contact urology clinic if any urologic-pertaining questions, concerns, new symptoms or problems arise in the interim period.  There are no Patient Instructions on file for this visit.  Electronically signed by:  Donnita Falls, MSN, FNP-C, CUNP 10/03/2023 11:36 AM

## 2023-10-03 ENCOUNTER — Ambulatory Visit (HOSPITAL_COMMUNITY)
Admission: RE | Admit: 2023-10-03 | Discharge: 2023-10-03 | Disposition: A | Discharge status: Home / Self Care | Payer: Medicare Other | Source: Ambulatory Visit | Attending: Urology | Admitting: Urology

## 2023-10-03 ENCOUNTER — Ambulatory Visit: Payer: Medicare Other | Admitting: Urology

## 2023-10-03 ENCOUNTER — Encounter: Payer: Self-pay | Admitting: Urology

## 2023-10-03 VITALS — BP 169/90 | HR 76

## 2023-10-03 DIAGNOSIS — I878 Other specified disorders of veins: Secondary | ICD-10-CM | POA: Diagnosis not present

## 2023-10-03 DIAGNOSIS — R319 Hematuria, unspecified: Secondary | ICD-10-CM | POA: Diagnosis not present

## 2023-10-03 DIAGNOSIS — N200 Calculus of kidney: Secondary | ICD-10-CM | POA: Diagnosis not present

## 2023-10-03 DIAGNOSIS — Z87442 Personal history of urinary calculi: Secondary | ICD-10-CM | POA: Diagnosis not present

## 2023-10-03 LAB — URINALYSIS, ROUTINE W REFLEX MICROSCOPIC
Bilirubin, UA: NEGATIVE
Glucose, UA: NEGATIVE
Ketones, UA: NEGATIVE
Leukocytes,UA: NEGATIVE
Nitrite, UA: NEGATIVE
Protein,UA: NEGATIVE
RBC, UA: NEGATIVE
Specific Gravity, UA: 1.02 (ref 1.005–1.030)
Urobilinogen, Ur: 0.2 mg/dL (ref 0.2–1.0)
pH, UA: 6 (ref 5.0–7.5)

## 2023-10-16 ENCOUNTER — Telehealth: Payer: Self-pay

## 2023-10-16 NOTE — Telephone Encounter (Signed)
-----   Message from Donnita Falls sent at 10/15/2023  1:44 PM EST ----- Please let pt know KUB was normal - no stones visualized. Thanks.

## 2023-10-16 NOTE — Telephone Encounter (Signed)
Patient was made aware and voiced understanding.

## 2023-11-26 DIAGNOSIS — Z79899 Other long term (current) drug therapy: Secondary | ICD-10-CM | POA: Diagnosis not present

## 2023-11-26 DIAGNOSIS — E1169 Type 2 diabetes mellitus with other specified complication: Secondary | ICD-10-CM | POA: Diagnosis not present

## 2023-11-26 DIAGNOSIS — E7800 Pure hypercholesterolemia, unspecified: Secondary | ICD-10-CM | POA: Diagnosis not present

## 2023-11-26 DIAGNOSIS — Z0000 Encounter for general adult medical examination without abnormal findings: Secondary | ICD-10-CM | POA: Diagnosis not present

## 2023-11-26 DIAGNOSIS — I700 Atherosclerosis of aorta: Secondary | ICD-10-CM | POA: Diagnosis not present

## 2023-11-26 DIAGNOSIS — M19041 Primary osteoarthritis, right hand: Secondary | ICD-10-CM | POA: Diagnosis not present

## 2024-01-25 DIAGNOSIS — E7800 Pure hypercholesterolemia, unspecified: Secondary | ICD-10-CM | POA: Diagnosis not present

## 2024-01-25 DIAGNOSIS — Z79899 Other long term (current) drug therapy: Secondary | ICD-10-CM | POA: Diagnosis not present

## 2024-02-21 DIAGNOSIS — E1169 Type 2 diabetes mellitus with other specified complication: Secondary | ICD-10-CM | POA: Diagnosis not present

## 2024-03-12 DIAGNOSIS — L820 Inflamed seborrheic keratosis: Secondary | ICD-10-CM | POA: Diagnosis not present

## 2024-05-19 DIAGNOSIS — E119 Type 2 diabetes mellitus without complications: Secondary | ICD-10-CM | POA: Diagnosis not present

## 2024-05-26 DIAGNOSIS — E7800 Pure hypercholesterolemia, unspecified: Secondary | ICD-10-CM | POA: Diagnosis not present

## 2024-05-26 DIAGNOSIS — E1169 Type 2 diabetes mellitus with other specified complication: Secondary | ICD-10-CM | POA: Diagnosis not present

## 2024-05-26 DIAGNOSIS — R5383 Other fatigue: Secondary | ICD-10-CM | POA: Diagnosis not present

## 2024-06-04 DIAGNOSIS — D225 Melanocytic nevi of trunk: Secondary | ICD-10-CM | POA: Diagnosis not present

## 2024-06-04 DIAGNOSIS — L02821 Furuncle of head [any part, except face]: Secondary | ICD-10-CM | POA: Diagnosis not present

## 2024-06-04 DIAGNOSIS — B9689 Other specified bacterial agents as the cause of diseases classified elsewhere: Secondary | ICD-10-CM | POA: Diagnosis not present

## 2024-10-02 ENCOUNTER — Ambulatory Visit: Payer: Medicare Other | Admitting: Urology
